# Patient Record
Sex: Female | Born: 1960 | Race: White | Hispanic: No | Marital: Married | Smoking: Never smoker
Health system: Southern US, Community
[De-identification: ages and names within clinical notes are randomized; demographics above are authoritative.]

## PROBLEM LIST (undated history)

## (undated) DIAGNOSIS — N200 Calculus of kidney: Secondary | ICD-10-CM

## (undated) DIAGNOSIS — I10 Essential (primary) hypertension: Secondary | ICD-10-CM

---

## 2021-03-09 ENCOUNTER — Emergency Department (HOSPITAL_BASED_OUTPATIENT_CLINIC_OR_DEPARTMENT_OTHER): Payer: BC Managed Care – PPO

## 2021-03-09 ENCOUNTER — Emergency Department (HOSPITAL_COMMUNITY): Payer: BC Managed Care – PPO

## 2021-03-09 ENCOUNTER — Inpatient Hospital Stay (HOSPITAL_BASED_OUTPATIENT_CLINIC_OR_DEPARTMENT_OTHER)
Admission: EM | Admit: 2021-03-09 | Discharge: 2021-03-12 | DRG: 156 | Disposition: A | Discharge status: Home / Self Care | Payer: BC Managed Care – PPO | Attending: Internal Medicine | Admitting: Internal Medicine

## 2021-03-09 DIAGNOSIS — I639 Cerebral infarction, unspecified: Secondary | ICD-10-CM | POA: Diagnosis not present

## 2021-03-09 DIAGNOSIS — G463 Brain stem stroke syndrome: Secondary | ICD-10-CM | POA: Diagnosis not present

## 2021-03-09 DIAGNOSIS — Z91048 Other nonmedicinal substance allergy status: Secondary | ICD-10-CM

## 2021-03-09 DIAGNOSIS — Z888 Allergy status to other drugs, medicaments and biological substances status: Secondary | ICD-10-CM

## 2021-03-09 DIAGNOSIS — R1310 Dysphagia, unspecified: Secondary | ICD-10-CM | POA: Diagnosis present

## 2021-03-09 DIAGNOSIS — E7800 Pure hypercholesterolemia, unspecified: Secondary | ICD-10-CM

## 2021-03-09 DIAGNOSIS — Z882 Allergy status to sulfonamides status: Secondary | ICD-10-CM

## 2021-03-09 DIAGNOSIS — I10 Essential (primary) hypertension: Secondary | ICD-10-CM | POA: Diagnosis present

## 2021-03-09 DIAGNOSIS — Z881 Allergy status to other antibiotic agents status: Secondary | ICD-10-CM

## 2021-03-09 DIAGNOSIS — J3801 Paralysis of vocal cords and larynx, unilateral: Secondary | ICD-10-CM | POA: Diagnosis not present

## 2021-03-09 DIAGNOSIS — W57XXXA Bitten or stung by nonvenomous insect and other nonvenomous arthropods, initial encounter: Secondary | ICD-10-CM | POA: Diagnosis present

## 2021-03-09 DIAGNOSIS — M25612 Stiffness of left shoulder, not elsewhere classified: Secondary | ICD-10-CM

## 2021-03-09 DIAGNOSIS — E042 Nontoxic multinodular goiter: Secondary | ICD-10-CM | POA: Diagnosis present

## 2021-03-09 DIAGNOSIS — J351 Hypertrophy of tonsils: Secondary | ICD-10-CM | POA: Diagnosis present

## 2021-03-09 DIAGNOSIS — R490 Dysphonia: Secondary | ICD-10-CM

## 2021-03-09 DIAGNOSIS — J3800 Paralysis of vocal cords and larynx, unspecified: Secondary | ICD-10-CM

## 2021-03-09 DIAGNOSIS — E041 Nontoxic single thyroid nodule: Secondary | ICD-10-CM

## 2021-03-09 DIAGNOSIS — E538 Deficiency of other specified B group vitamins: Secondary | ICD-10-CM | POA: Diagnosis present

## 2021-03-09 DIAGNOSIS — E860 Dehydration: Secondary | ICD-10-CM | POA: Diagnosis present

## 2021-03-09 DIAGNOSIS — Z91040 Latex allergy status: Secondary | ICD-10-CM

## 2021-03-09 DIAGNOSIS — R221 Localized swelling, mass and lump, neck: Secondary | ICD-10-CM

## 2021-03-09 DIAGNOSIS — Z793 Long term (current) use of hormonal contraceptives: Secondary | ICD-10-CM

## 2021-03-09 DIAGNOSIS — R030 Elevated blood-pressure reading, without diagnosis of hypertension: Secondary | ICD-10-CM | POA: Diagnosis present

## 2021-03-09 DIAGNOSIS — Z20822 Contact with and (suspected) exposure to covid-19: Secondary | ICD-10-CM | POA: Diagnosis present

## 2021-03-09 HISTORY — DX: Essential (primary) hypertension: I10

## 2021-03-09 HISTORY — DX: Calculus of kidney: N20.0

## 2021-03-09 LAB — CBC WITH DIFFERENTIAL/PLATELET
Abs Immature Granulocytes: 0.02 10*3/uL (ref 0.00–0.07)
Basophils Absolute: 0 10*3/uL (ref 0.0–0.1)
Basophils Relative: 1 %
Eosinophils Absolute: 0.1 10*3/uL (ref 0.0–0.5)
Eosinophils Relative: 2 %
HCT: 41 % (ref 36.0–46.0)
Hemoglobin: 14 g/dL (ref 12.0–15.0)
Immature Granulocytes: 0 %
Lymphocytes Relative: 26 %
Lymphs Abs: 1.9 10*3/uL (ref 0.7–4.0)
MCH: 29.9 pg (ref 26.0–34.0)
MCHC: 34.1 g/dL (ref 30.0–36.0)
MCV: 87.4 fL (ref 80.0–100.0)
Monocytes Absolute: 0.7 10*3/uL (ref 0.1–1.0)
Monocytes Relative: 10 %
Neutro Abs: 4.5 10*3/uL (ref 1.7–7.7)
Neutrophils Relative %: 61 %
Platelets: 246 10*3/uL (ref 150–400)
RBC: 4.69 MIL/uL (ref 3.87–5.11)
RDW: 13.2 % (ref 11.5–15.5)
WBC: 7.3 10*3/uL (ref 4.0–10.5)
nRBC: 0 % (ref 0.0–0.2)

## 2021-03-09 LAB — COMPREHENSIVE METABOLIC PANEL
ALT: 17 U/L (ref 0–44)
AST: 17 U/L (ref 15–41)
Albumin: 4.6 g/dL (ref 3.5–5.0)
Alkaline Phosphatase: 72 U/L (ref 38–126)
Anion gap: 9 (ref 5–15)
BUN: 15 mg/dL (ref 6–20)
CO2: 27 mmol/L (ref 22–32)
Calcium: 9.6 mg/dL (ref 8.9–10.3)
Chloride: 105 mmol/L (ref 98–111)
Creatinine, Ser: 0.72 mg/dL (ref 0.44–1.00)
GFR, Estimated: >60 mL/min (ref >60)
Glucose, Bld: 94 mg/dL (ref 70–99)
Potassium: 3.8 mmol/L (ref 3.5–5.1)
Sodium: 141 mmol/L (ref 135–145)
Total Bilirubin: 0.7 mg/dL (ref 0.3–1.2)
Total Protein: 7.4 g/dL (ref 6.5–8.1)

## 2021-03-09 LAB — RESP PANEL BY RT-PCR (FLU A&B, COVID) ARPGX2
Influenza A by PCR: NEGATIVE
Influenza B by PCR: NEGATIVE
SARS Coronavirus 2 by RT PCR: NEGATIVE

## 2021-03-09 LAB — GROUP A STREP BY PCR: Group A Strep by PCR: NOT DETECTED

## 2021-03-09 IMAGING — MR MR CERVICAL SPINE WO/W CM
5 of 8 series · 25 of 48 positions shown · IV contrast (Gadavist)
Comparison: Prior CT from earlier the same day.

CLINICAL DATA: Initial evaluation for possible stroke versus
demyelinating disease.

EXAM:
MRI HEAD WITHOUT AND WITH CONTRAST
MRI CERVICAL SPINE WITHOUT AND WITH CONTRAST
TECHNIQUE: Multiplanar, multiecho pulse sequences of the brain and surrounding
structures, and cervical spine, to include the craniocervical
junction and cervicothoracic junction, were obtained without and
with intravenous contrast.
CONTRAST:  7mL GADAVIST GADOBUTROL 1 MMOL/ML IV SOLN

[Series 6: T2 · sagittal · 3.0mm · 0.69mm/px · 3 of 15 slices shown (1 of 2)]
[im 1/15]
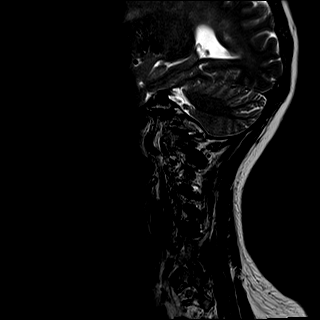
[im 8/15]
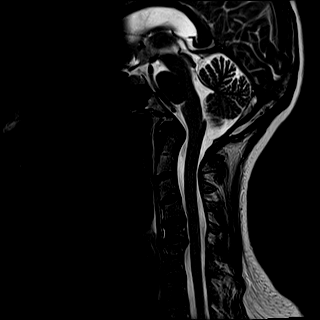
[im 15/15]
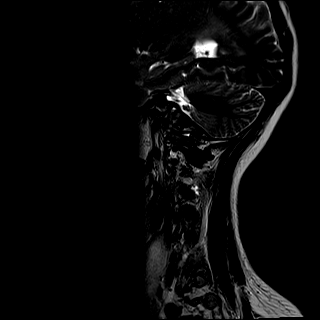

[Series 7: T1 · sagittal · 3.0mm · 0.69mm/px · 3 of 15 slices shown (1 of 2)]
[im 1/15]
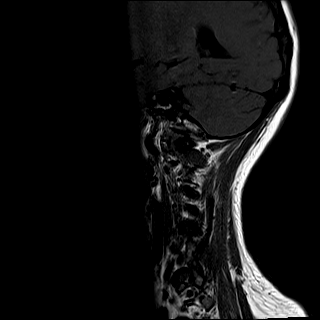
[im 8/15]
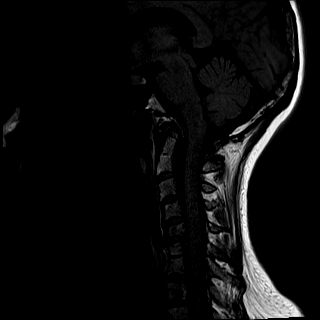
[im 15/15]
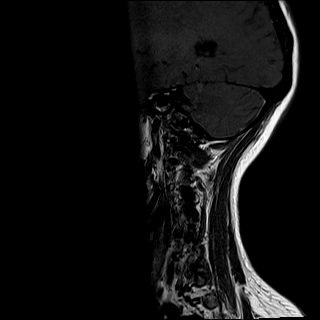

[Series 9: T2 · axial · 3.0mm · 0.66mm/px · z∈[-177,-61]mm · 9 of 39 slices shown (2 of 2)]
[im 1/39]
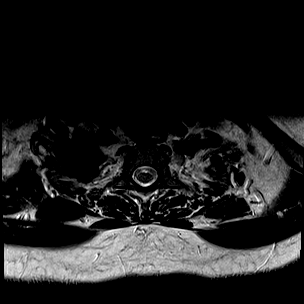
[im 5/39]
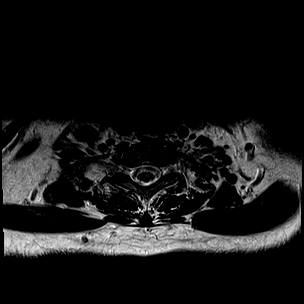
[im 10/39]
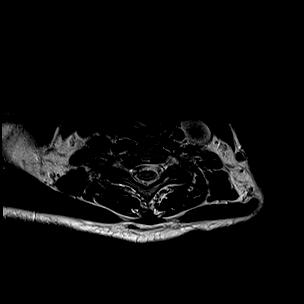
[im 15/39]
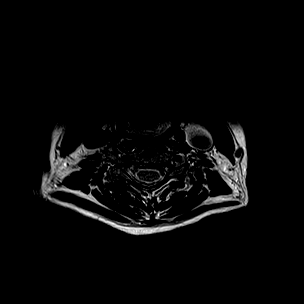
[im 20/39]
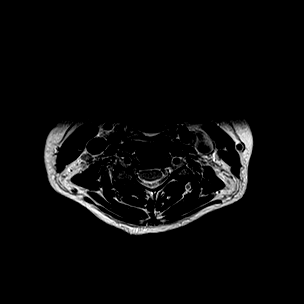
[im 24/39]
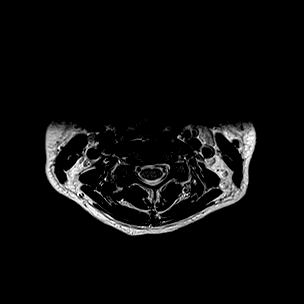
[im 29/39]
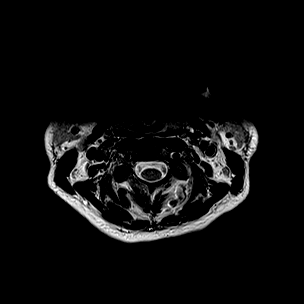
[im 34/39]
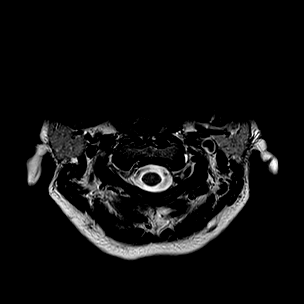
[im 39/39]
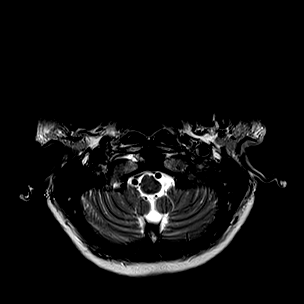

[Series 11: T1 · axial · 3.0mm · 0.39mm/px · z∈[-177,-61]mm · 9 of 39 slices shown (2 of 2)]
[im 1/39]
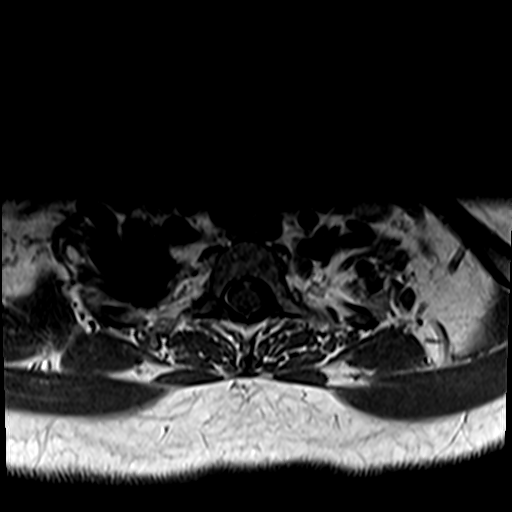
[im 5/39]
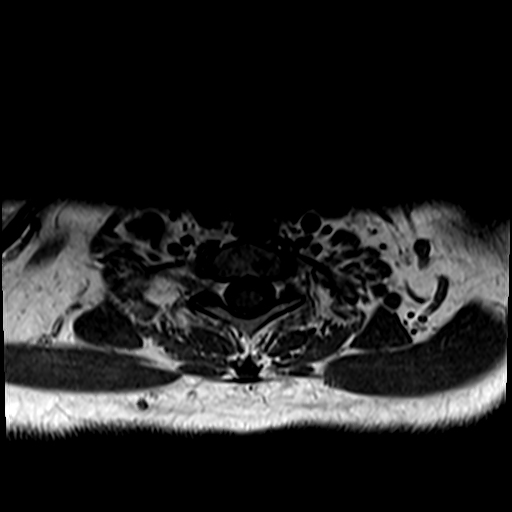
[im 10/39]
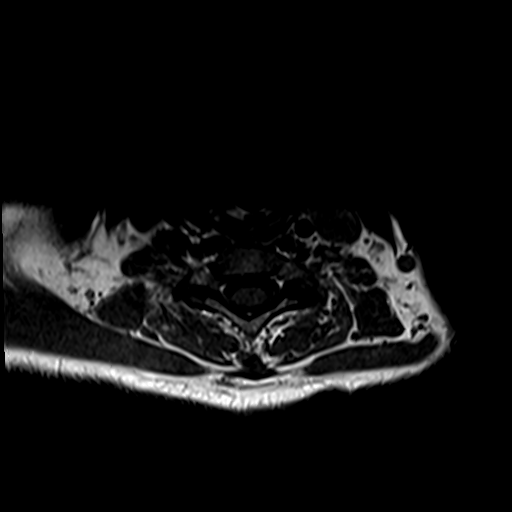
[im 15/39]
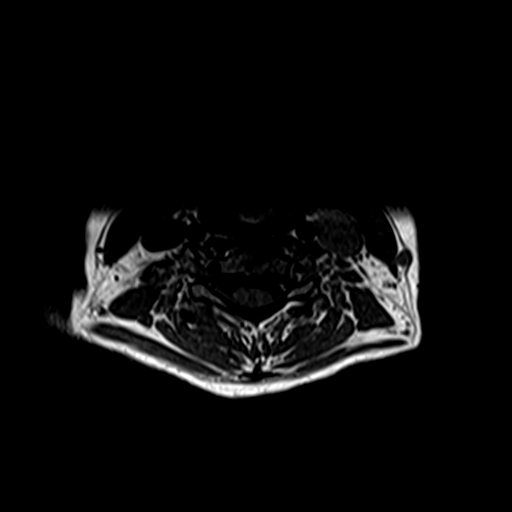
[im 20/39]
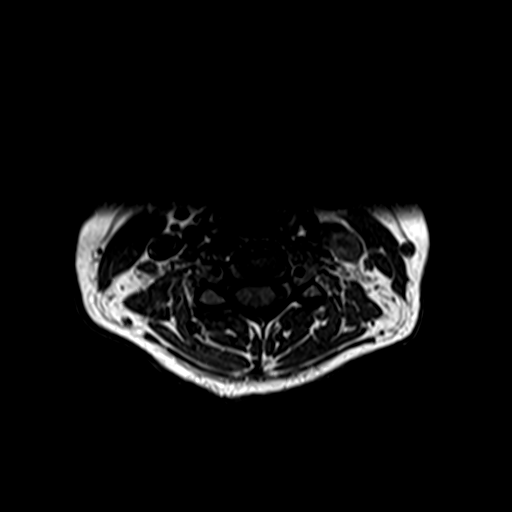
[im 24/39]
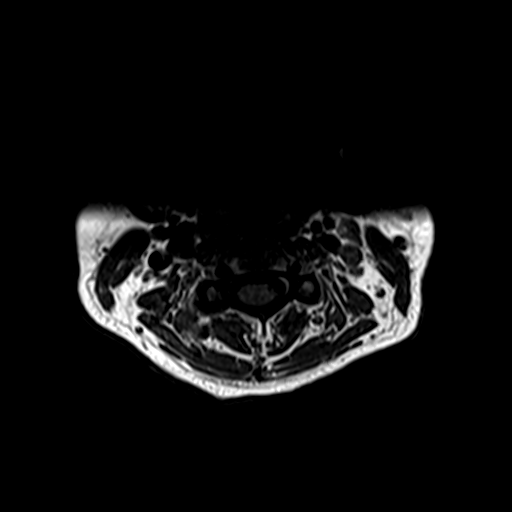
[im 29/39]
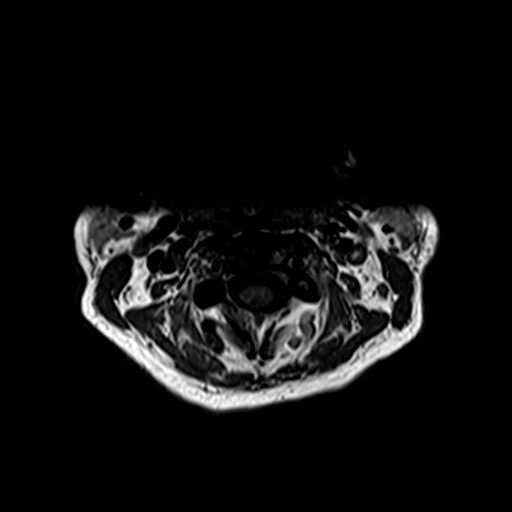
[im 34/39]
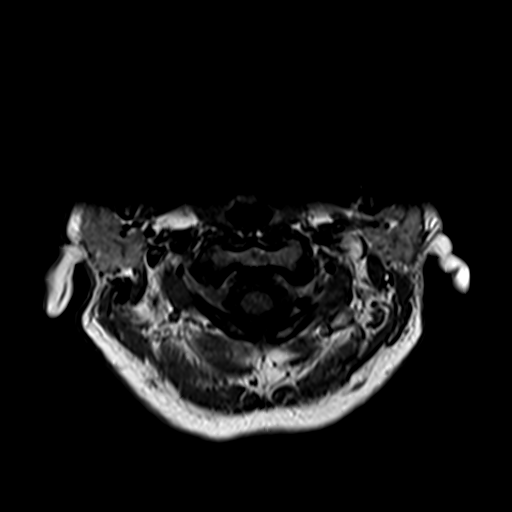
[im 39/39]
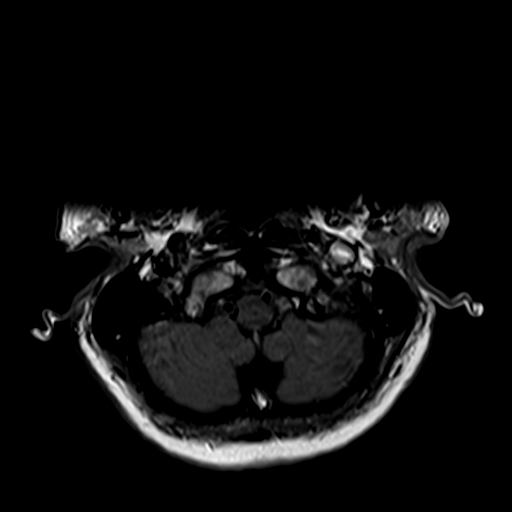

[Series 12: T1 fat-sat post-contrast · sagittal · 3.0mm · 0.43mm/px · 1 of 15 slices shown]
[im 1/15]
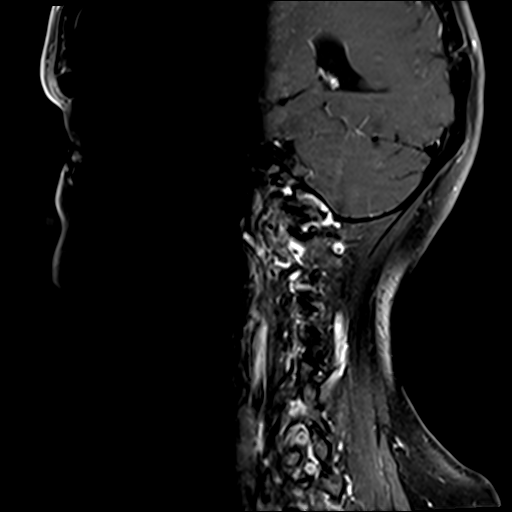

[25 of 48 positions shown; findings below may reference images not displayed]

FINDINGS: MRI HEAD FINDINGS

Brain: Cerebral volume within normal limits for age. Patchy and hazy
T2/FLAIR hyperintensity seen involving the periventricular and deep
white matter of both cerebral hemispheres, nonspecific, but overall
mild to moderate in nature. Overall, changes are most pronounced
within the right frontal lobe (series 11, image 18). Few
corresponding areas of T1 hypointensity noted about the basal
ganglia (series 16, images 28, 31), favored to reflect small remote
lacunar infarcts. No infratentorial signal abnormality.

No abnormal enhancement to suggest acute or subacute ischemia or
active demyelination. Gray-white matter differentiation maintained.
No encephalomalacia to suggest chronic cortical infarction. No foci
of susceptibility artifact to suggest acute or chronic intracranial
hemorrhage.

No definite mass lesion. No mass effect or midline shift. Ventricles
normal in size without hydrocephalus. Cavum et septum pellucidum
noted. No extra-axial fluid collection. Pituitary gland and
suprasellar region within normal limits. Midline structures intact
and normal. Prominent calcification noted along the anterior falx.

There is an apparent punctate 3 mm focus of enhancement involving
the inferior left basal ganglia (series 18, image 21). Finding of
uncertain etiology or significance. No other abnormal enhancement.

Vascular: Major intracranial vascular flow voids are maintained.

Skull and upper cervical spine: Craniocervical junction within
normal limits. Bone marrow signal intensity normal. No scalp soft
tissue abnormality.

Sinuses/Orbits: Globes and orbital soft tissues within normal
limits. Paranasal sinuses are largely clear. No mastoid effusion.
Inner ear structures grossly normal.

Other: None.

MRI CERVICAL SPINE FINDINGS

Alignment: Straightening with mild reversal of the normal cervical
lordosis. Trace anterolisthesis of C4 on C5, with trace
retrolisthesis of C5 on C6.

Vertebrae: Congenital fusion of the C6 and C7 vertebral bodies as
well as their posterior elements noted. Vertebral body height
maintained without acute or chronic fracture. Bone marrow signal
intensity within normal limits. No discrete or worrisome osseous
lesions. No abnormal marrow edema or enhancement.

Cord: Signal intensity within the cervical spinal cord is normal.
Normal cord signal changes to suggest demyelinating disease. Normal
cord caliber morphology. No abnormal enhancement.

Posterior Fossa, vertebral arteries, paraspinal tissues:
Craniocervical junction within normal limits. Paraspinous and
prevertebral soft tissues within normal limits. Normal flow voids
seen within the vertebral arteries bilaterally.

Disc levels:

C2-C3: Unremarkable.

C3-C4: Mild right greater than left uncovertebral hypertrophy
without significant disc bulge. Mild right-sided facet degeneration.
No spinal stenosis. Mild right C4 foraminal narrowing. Left neural
foramina remains patent.

C4-C5: Mild disc bulge with uncovertebral and endplate spurring.
Flattening of the ventral thecal sac without significant spinal
stenosis or cord deformity. Mild right C5 foraminal stenosis. Left
neural foramina remains patent.

C5-C6: Degenerative intervertebral disc space narrowing with diffuse
disc osteophyte complex. Broad posterior component flattens and
partially faces the ventral thecal sac with mild spinal stenosis. No
more than minimal flattening of the ventral cord. Moderate left with
mild right C6 foraminal narrowing.

C6-C7: Congenital fusion of C6 and C7. No canal or foraminal
stenosis.

C7-T1: Mild disc bulge with right greater than left uncovertebral
hypertrophy. Bilateral facet degeneration. No spinal stenosis. Mild
to moderate right C8 foraminal narrowing. Left neural foramina
remains patent.

Visualized upper thoracic spine demonstrates no significant finding.
IMPRESSION: MRI HEAD IMPRESSION:

1. Patchy and hazy T2/FLAIR hyperintensity involving the
supratentorial cerebral white matter as above, nonspecific, but
overall mild to moderate in nature. While these findings are
nonspecific, changes of chronic microvascular ischemic disease are
favored. Possible demyelinating disease is not entirely excluded,
although appearance would be atypical for this process. No
convincing changes of active demyelination.
2. Punctate 3 mm focus of enhancement involving the inferior left
basal ganglia. Finding is of uncertain etiology or significance, and
could reflect a small vascular structure. Outpatient neurology
follow-up with short interval follow-up MRI in 2-3 months to
document stability recommended.
3. Otherwise negative brain MRI for age. No other acute intracranial
abnormality.

MRI CERVICAL SPINE IMPRESSION:

1. Normal MRI appearance of the cervical spinal cord. No evidence
for demyelinating disease.
2. Multilevel degenerative spondylosis with resultant mild spinal
stenosis at C5-6. Associated mild-to-moderate right C4, C5, C6, and
C8 foraminal stenosis, with moderate left C6 foraminal narrowing.

## 2021-03-09 IMAGING — DX DG CHEST 1V PORT
1 series · 1 of 1 positions shown · non-contrast
Comparison: None.

CLINICAL DATA: Shortness of breath.  Pharyngitis.

EXAM:
PORTABLE CHEST 1 VIEW

[chest]
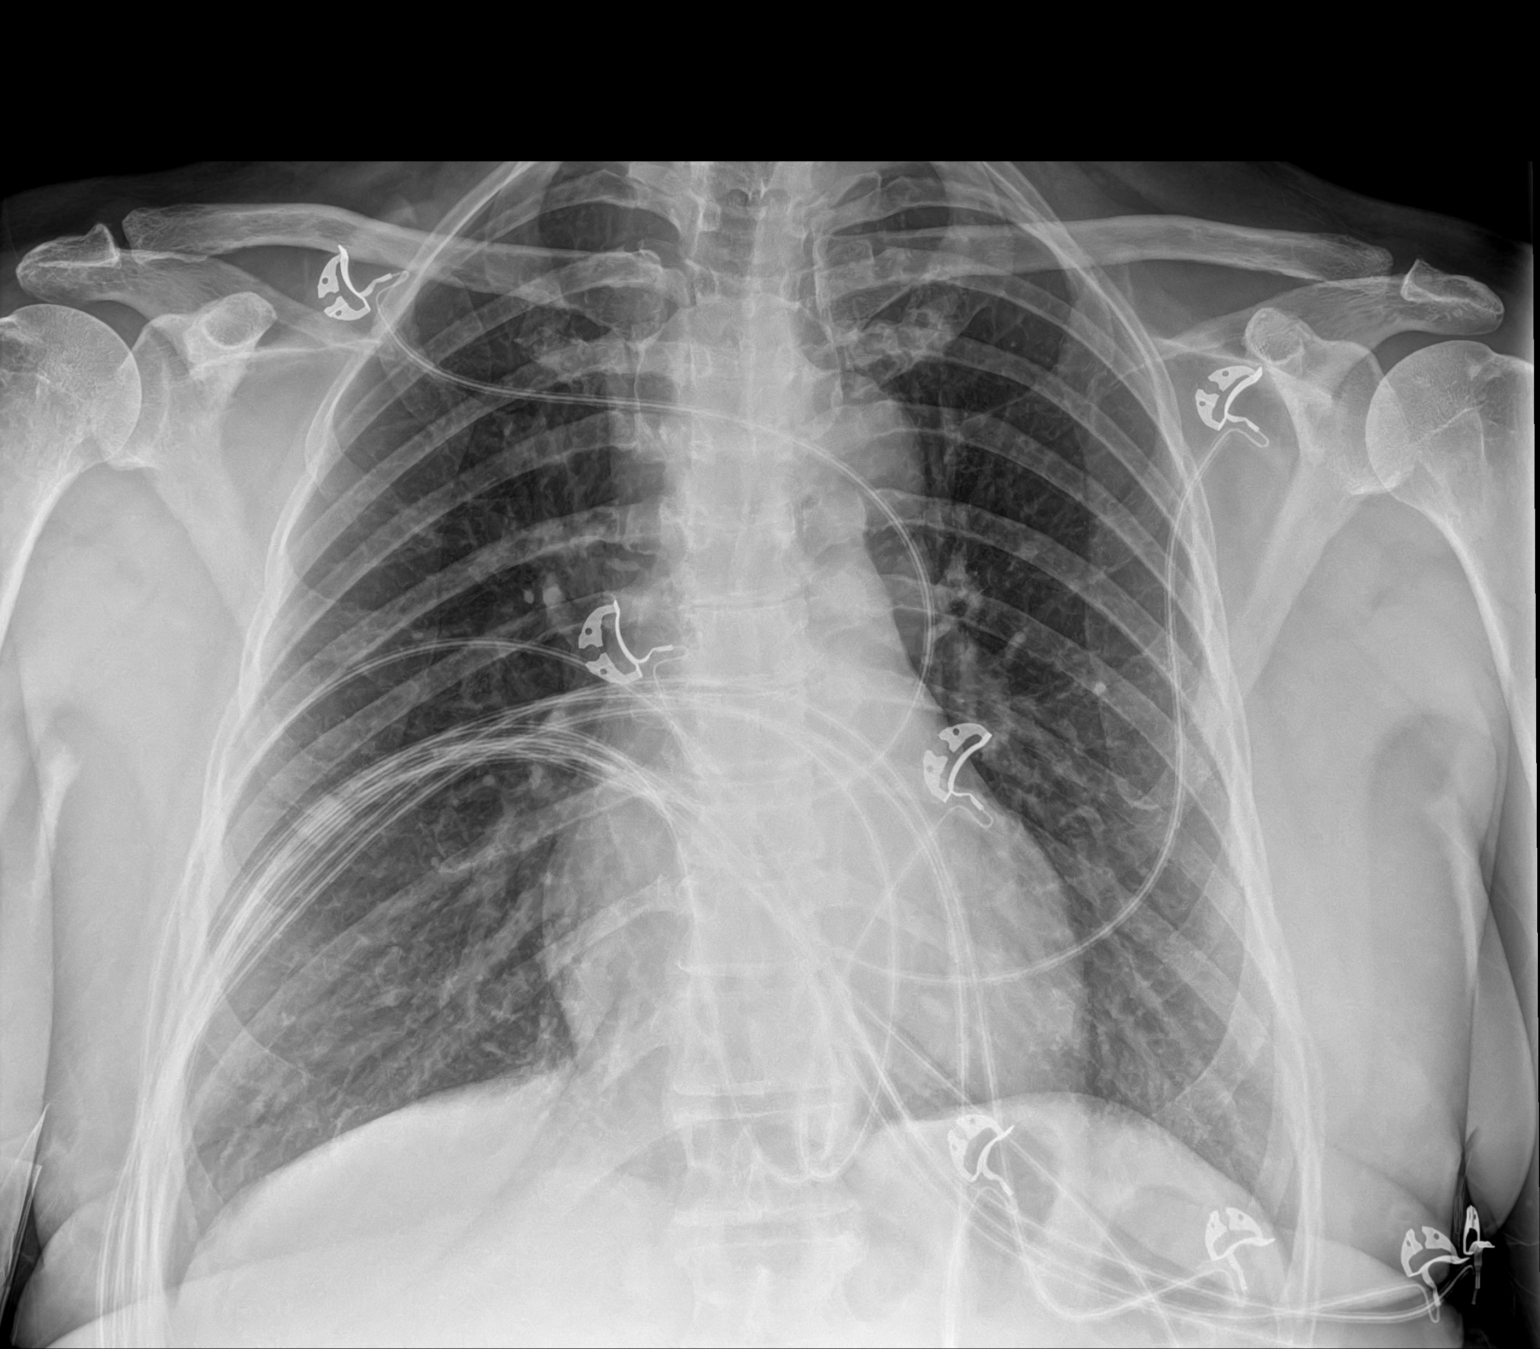

[1 of 1 positions shown; findings below may reference images not displayed]

FINDINGS: The heart size and mediastinal contours are within normal limits.
Both lungs are clear. The visualized skeletal structures are
unremarkable.
IMPRESSION: No active disease.

## 2021-03-09 IMAGING — MR MR HEAD WO/W CM
14 of 16 series · 41 of 48 positions shown · IV contrast (gadavist)
Comparison: Prior CT from earlier the same day.

CLINICAL DATA: Initial evaluation for possible stroke versus
demyelinating disease.

EXAM:
MRI HEAD WITHOUT AND WITH CONTRAST
MRI CERVICAL SPINE WITHOUT AND WITH CONTRAST
TECHNIQUE: Multiplanar, multiecho pulse sequences of the brain and surrounding
structures, and cervical spine, to include the craniocervical
junction and cervicothoracic junction, were obtained without and
with intravenous contrast.
CONTRAST:  7mL GADAVIST GADOBUTROL 1 MMOL/ML IV SOLN

[Series 5: DWI · axial · 3.0mm · 0.88mm/px · z∈[-64,+78]mm · 7 of 100 slices shown (1 of 4)]
[im 1/100]
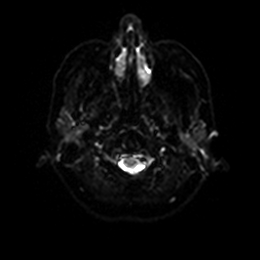
[im 17/100]
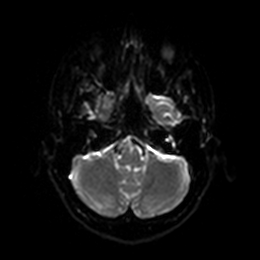
[im 34/100]
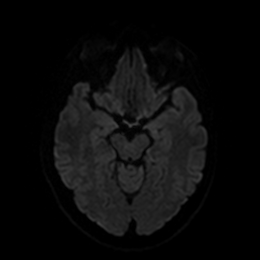
[im 50/100]
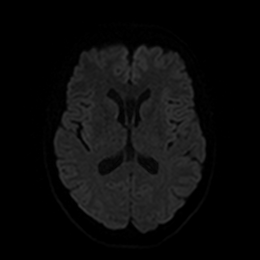
[im 67/100]
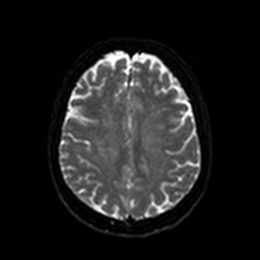
[im 83/100]
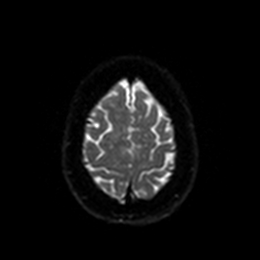
[im 100/100]
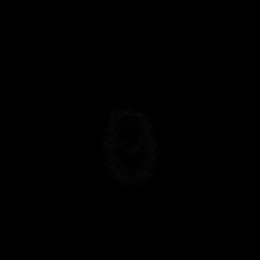

[Series 6: DWI · axial · 3.0mm · 0.88mm/px · z∈[-64,+78]mm · 3 of 50 slices shown (2 of 4)]
[im 1/50]
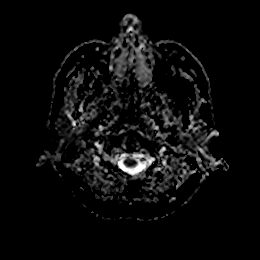
[im 25/50]
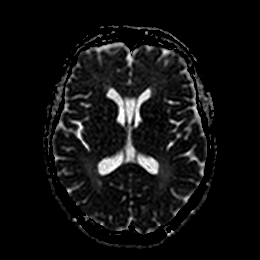
[im 50/50]
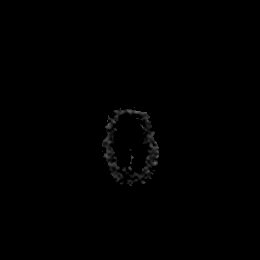

[Series 7: DWI · coronal · 4.0mm · 0.88mm/px · 5 of 76 slices shown (3 of 4)]
[im 1/76]
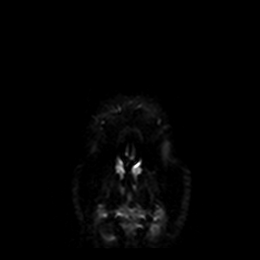
[im 19/76]
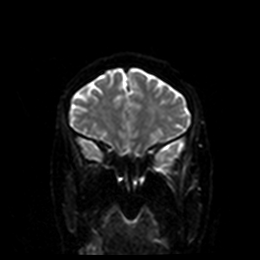
[im 38/76]
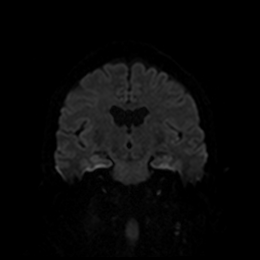
[im 57/76]
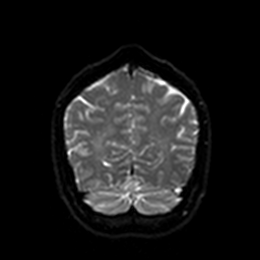
[im 76/76]
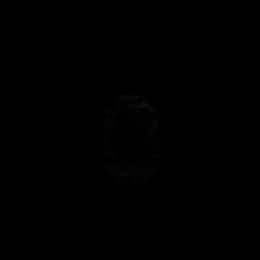

[Series 8: DWI · coronal · 4.0mm · 0.88mm/px · 3 of 38 slices shown (4 of 4)]
[im 1/38]
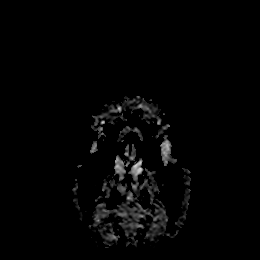
[im 19/38]
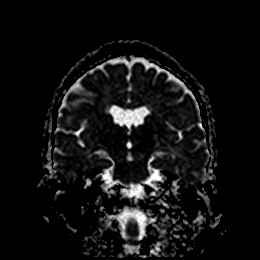
[im 38/38]
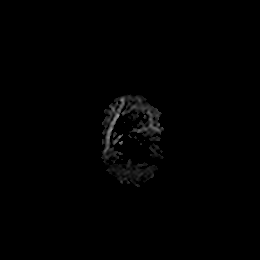

[Series 9: T1 · sagittal · 5.0mm · 0.75mm/px · 2 of 25 slices shown (1 of 2)]
[im 1/25]
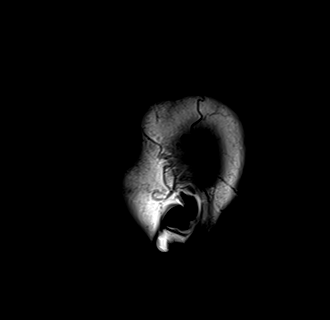
[im 25/25]
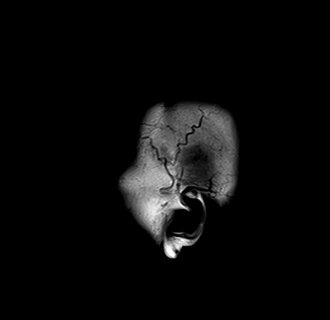

[Series 10: T2 · axial · 5.0mm · 0.72mm/px · z∈[-62,+77]mm · 2 of 25 slices shown]
[im 1/25]
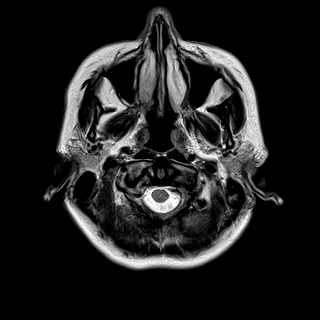
[im 25/25]
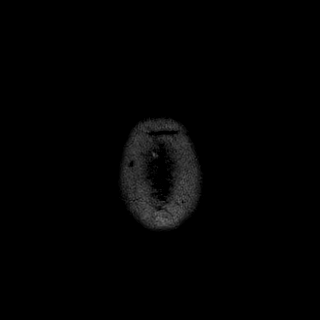

[Series 11: FLAIR · axial · 5.0mm · 0.45mm/px · z∈[-61,+78]mm · 2 of 25 slices shown]
[im 1/25]
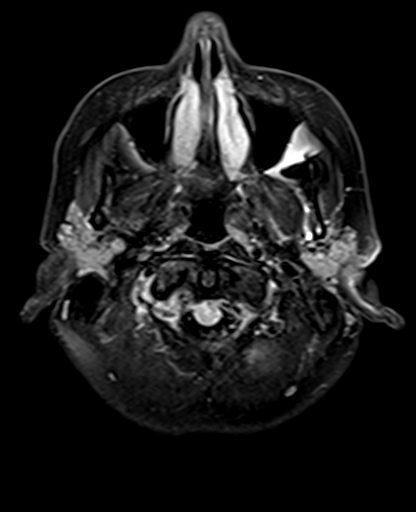
[im 25/25]
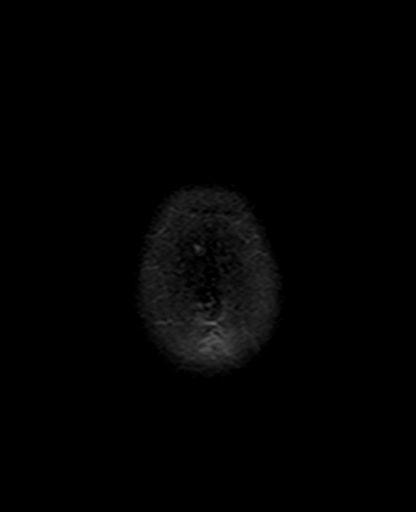

[Series 12: mag_images · axial · 3.0mm · 0.90mm/px · z∈[-65,+82]mm · 3 of 52 slices shown]
[im 1/52]
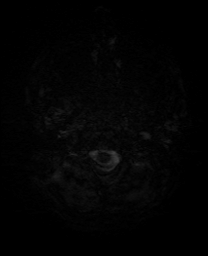
[im 26/52]
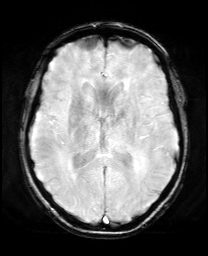
[im 52/52]
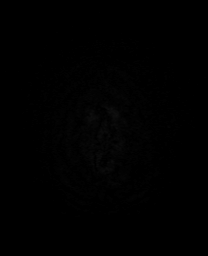

[Series 13: pha_images · axial · 3.0mm · 0.90mm/px · z∈[-65,+76]mm · 3 of 50 slices shown]
[im 1/50]
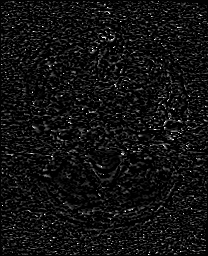
[im 25/50]
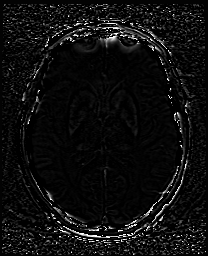
[im 50/50]
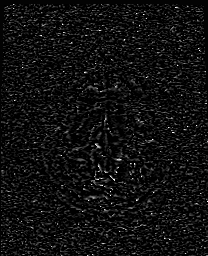

[Series 14: swi_images · axial · 3.0mm · 0.90mm/px · z∈[-65,+82]mm · 3 of 52 slices shown]
[im 1/52]
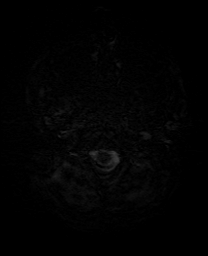
[im 26/52]
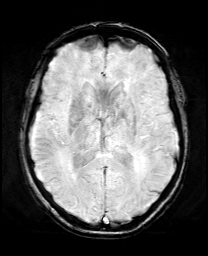
[im 52/52]
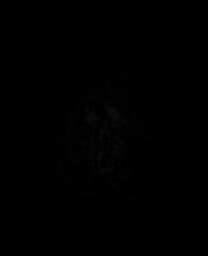

[Series 15: mip_images(sw) · axial · 24.0mm · 0.90mm/px · z∈[-55,+9]mm · 2 of 45 slices shown]
[im 1/45]
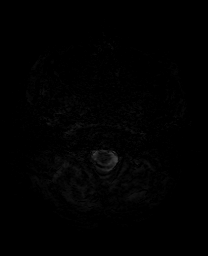
[im 23/45]
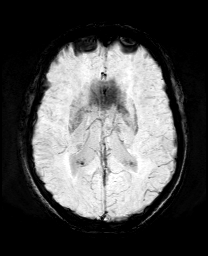

[Series 17: T2 post-contrast · coronal · 5.0mm · 0.72mm/px · 2 of 31 slices shown]
[im 1/31]
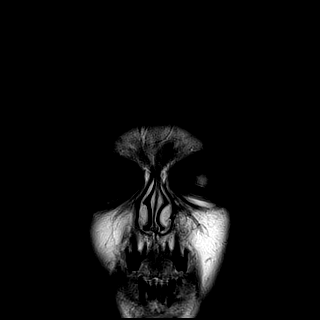
[im 31/31]
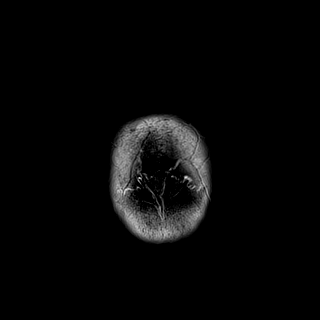

[Series 19: T1 post-contrast · coronal · 5.0mm · 0.34mm/px · 2 of 31 slices shown]
[im 1/31]
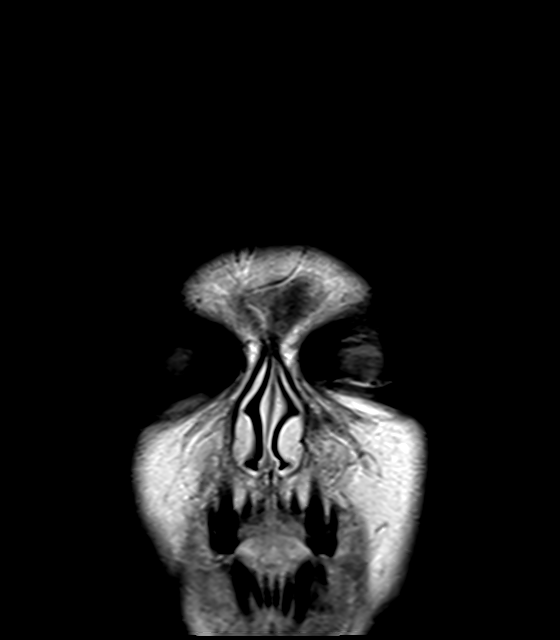
[im 31/31]
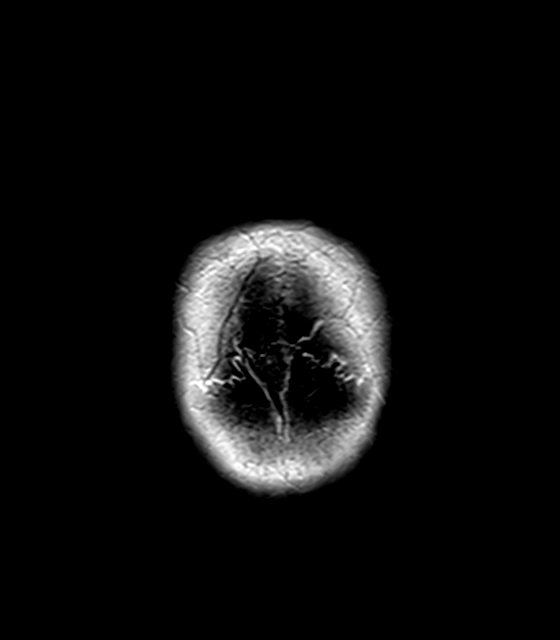

[Series 20: T1 · sagittal · 5.0mm · 0.75mm/px · 2 of 25 slices shown (2 of 2)]
[im 1/25]
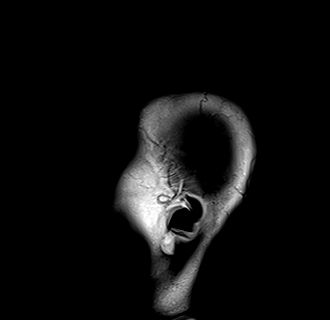
[im 25/25]
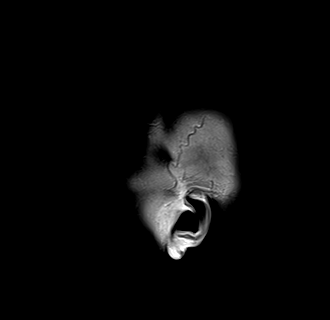

[41 of 48 positions shown; findings below may reference images not displayed]

FINDINGS: MRI HEAD FINDINGS

Brain: Cerebral volume within normal limits for age. Patchy and hazy
T2/FLAIR hyperintensity seen involving the periventricular and deep
white matter of both cerebral hemispheres, nonspecific, but overall
mild to moderate in nature. Overall, changes are most pronounced
within the right frontal lobe (series 11, image 18). Few
corresponding areas of T1 hypointensity noted about the basal
ganglia (series 16, images 28, 31), favored to reflect small remote
lacunar infarcts. No infratentorial signal abnormality.

No abnormal enhancement to suggest acute or subacute ischemia or
active demyelination. Gray-white matter differentiation maintained.
No encephalomalacia to suggest chronic cortical infarction. No foci
of susceptibility artifact to suggest acute or chronic intracranial
hemorrhage.

No definite mass lesion. No mass effect or midline shift. Ventricles
normal in size without hydrocephalus. Cavum et septum pellucidum
noted. No extra-axial fluid collection. Pituitary gland and
suprasellar region within normal limits. Midline structures intact
and normal. Prominent calcification noted along the anterior falx.

There is an apparent punctate 3 mm focus of enhancement involving
the inferior left basal ganglia (series 18, image 21). Finding of
uncertain etiology or significance. No other abnormal enhancement.

Vascular: Major intracranial vascular flow voids are maintained.

Skull and upper cervical spine: Craniocervical junction within
normal limits. Bone marrow signal intensity normal. No scalp soft
tissue abnormality.

Sinuses/Orbits: Globes and orbital soft tissues within normal
limits. Paranasal sinuses are largely clear. No mastoid effusion.
Inner ear structures grossly normal.

Other: None.

MRI CERVICAL SPINE FINDINGS

Alignment: Straightening with mild reversal of the normal cervical
lordosis. Trace anterolisthesis of C4 on C5, with trace
retrolisthesis of C5 on C6.

Vertebrae: Congenital fusion of the C6 and C7 vertebral bodies as
well as their posterior elements noted. Vertebral body height
maintained without acute or chronic fracture. Bone marrow signal
intensity within normal limits. No discrete or worrisome osseous
lesions. No abnormal marrow edema or enhancement.

Cord: Signal intensity within the cervical spinal cord is normal.
Normal cord signal changes to suggest demyelinating disease. Normal
cord caliber morphology. No abnormal enhancement.

Posterior Fossa, vertebral arteries, paraspinal tissues:
Craniocervical junction within normal limits. Paraspinous and
prevertebral soft tissues within normal limits. Normal flow voids
seen within the vertebral arteries bilaterally.

Disc levels:

C2-C3: Unremarkable.

C3-C4: Mild right greater than left uncovertebral hypertrophy
without significant disc bulge. Mild right-sided facet degeneration.
No spinal stenosis. Mild right C4 foraminal narrowing. Left neural
foramina remains patent.

C4-C5: Mild disc bulge with uncovertebral and endplate spurring.
Flattening of the ventral thecal sac without significant spinal
stenosis or cord deformity. Mild right C5 foraminal stenosis. Left
neural foramina remains patent.

C5-C6: Degenerative intervertebral disc space narrowing with diffuse
disc osteophyte complex. Broad posterior component flattens and
partially faces the ventral thecal sac with mild spinal stenosis. No
more than minimal flattening of the ventral cord. Moderate left with
mild right C6 foraminal narrowing.

C6-C7: Congenital fusion of C6 and C7. No canal or foraminal
stenosis.

C7-T1: Mild disc bulge with right greater than left uncovertebral
hypertrophy. Bilateral facet degeneration. No spinal stenosis. Mild
to moderate right C8 foraminal narrowing. Left neural foramina
remains patent.

Visualized upper thoracic spine demonstrates no significant finding.
IMPRESSION: MRI HEAD IMPRESSION:

1. Patchy and hazy T2/FLAIR hyperintensity involving the
supratentorial cerebral white matter as above, nonspecific, but
overall mild to moderate in nature. While these findings are
nonspecific, changes of chronic microvascular ischemic disease are
favored. Possible demyelinating disease is not entirely excluded,
although appearance would be atypical for this process. No
convincing changes of active demyelination.
2. Punctate 3 mm focus of enhancement involving the inferior left
basal ganglia. Finding is of uncertain etiology or significance, and
could reflect a small vascular structure. Outpatient neurology
follow-up with short interval follow-up MRI in 2-3 months to
document stability recommended.
3. Otherwise negative brain MRI for age. No other acute intracranial
abnormality.

MRI CERVICAL SPINE IMPRESSION:

1. Normal MRI appearance of the cervical spinal cord. No evidence
for demyelinating disease.
2. Multilevel degenerative spondylosis with resultant mild spinal
stenosis at C5-6. Associated mild-to-moderate right C4, C5, C6, and
C8 foraminal stenosis, with moderate left C6 foraminal narrowing.

## 2021-03-09 IMAGING — CT CT NECK W/ CM
4 of 5 series · 13 of 33 positions shown, 15 images · IV contrast (APPLIED)
Comparison: None.

CLINICAL DATA: Neck abscess, deep tissue.

EXAM:
CT NECK WITH CONTRAST
TECHNIQUE: Multidetector CT imaging of the neck was performed using the
standard protocol following the bolus administration of intravenous
contrast.
CONTRAST:  75mL OMNIPAQUE IOHEXOL 300 MG/ML  SOLN

[Series 3: ax bone · axial · 0.59mm/px · z∈[-501,-405]mm · 3 of 96 slices shown, 4 images]
[im 24/96  soft-tissue]
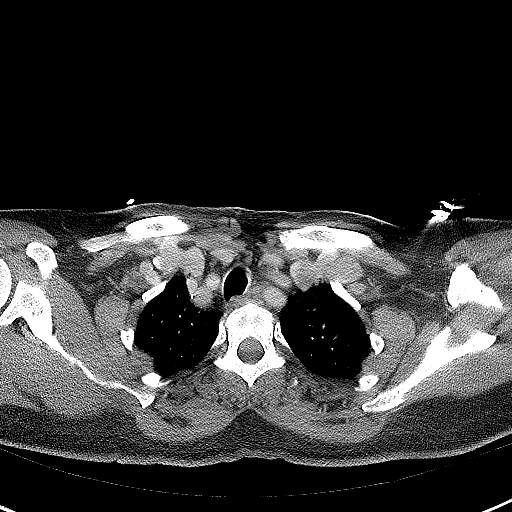
[im 24/96  bone]
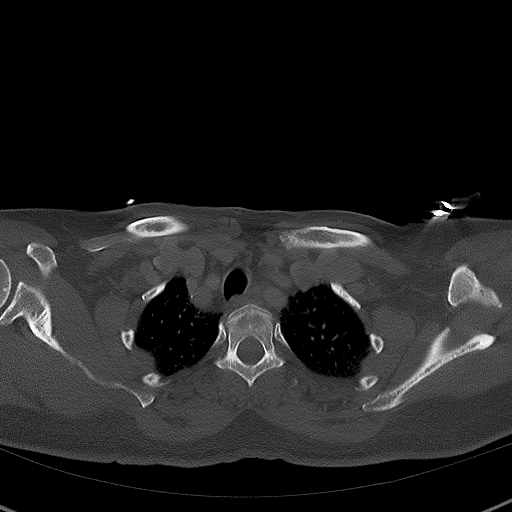
[im 48/96  bone]
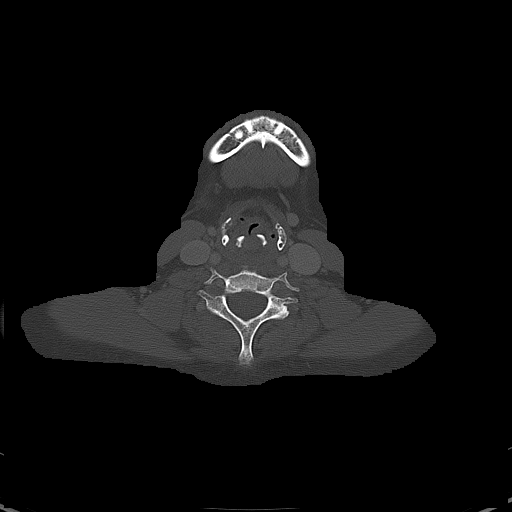
[im 72/96  bone]
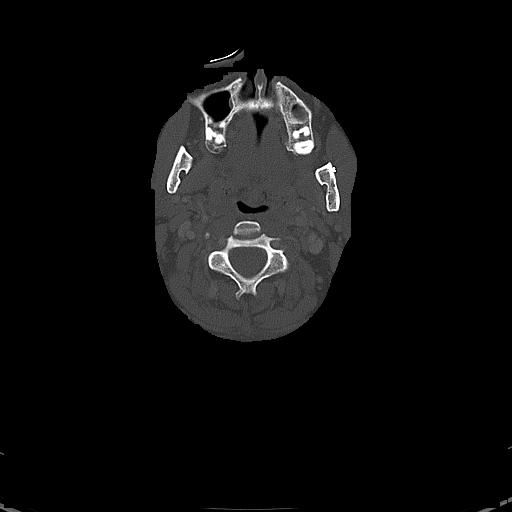

[Series 4: cor neck · coronal · 0.47mm/px · 3 of 114 slices shown]
[im 30/114  bone]
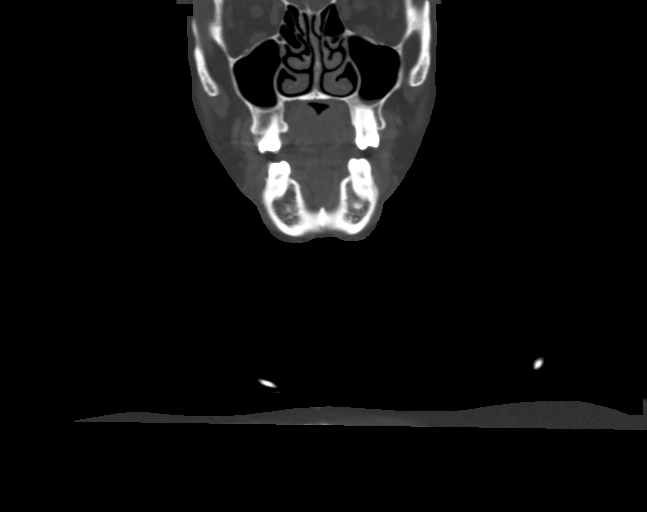
[im 48/114  bone]
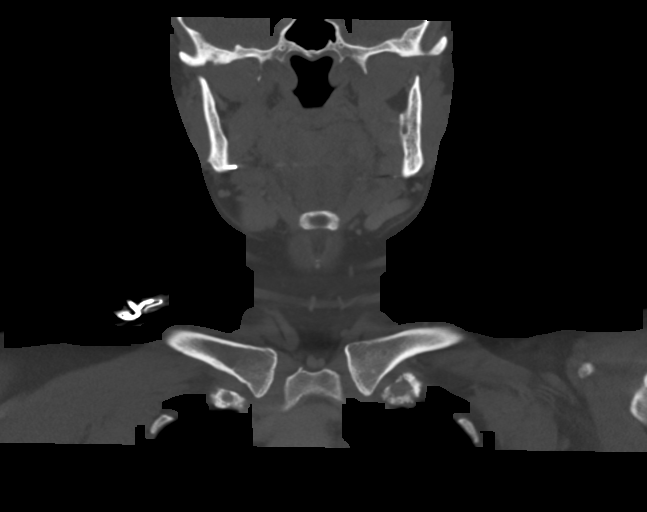
[im 66/114  bone]
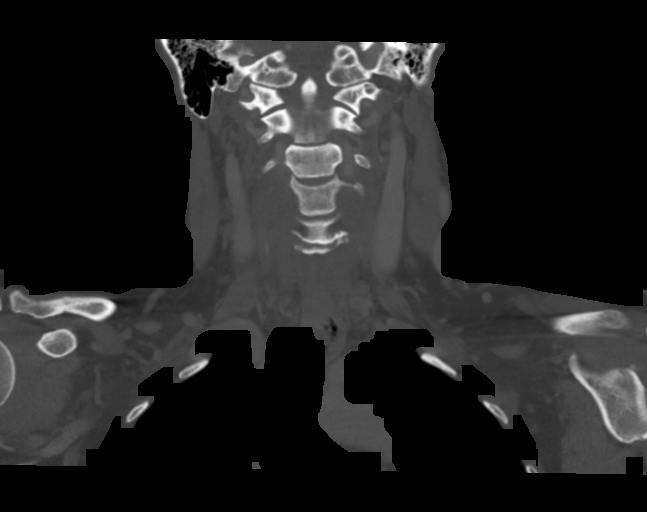

[Series 5: sag neck · sagittal · 0.41mm/px · 5 of 128 slices shown, 6 images]
[im 43/128  bone]
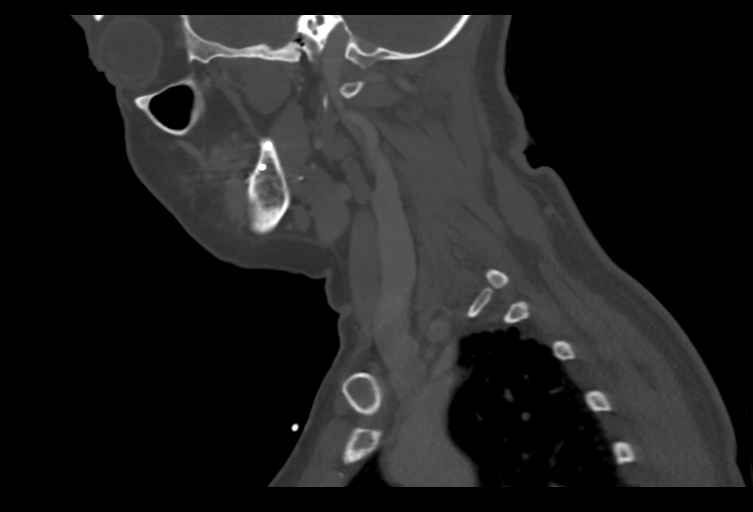
[im 53/128  bone]
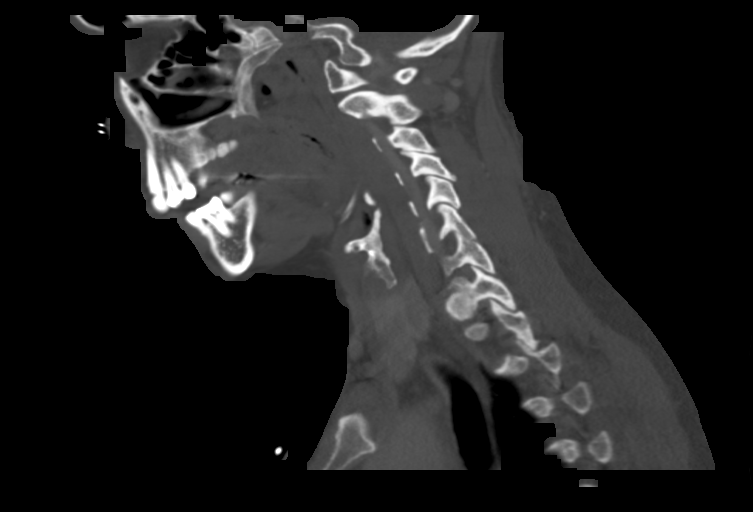
[im 64/128  soft-tissue]
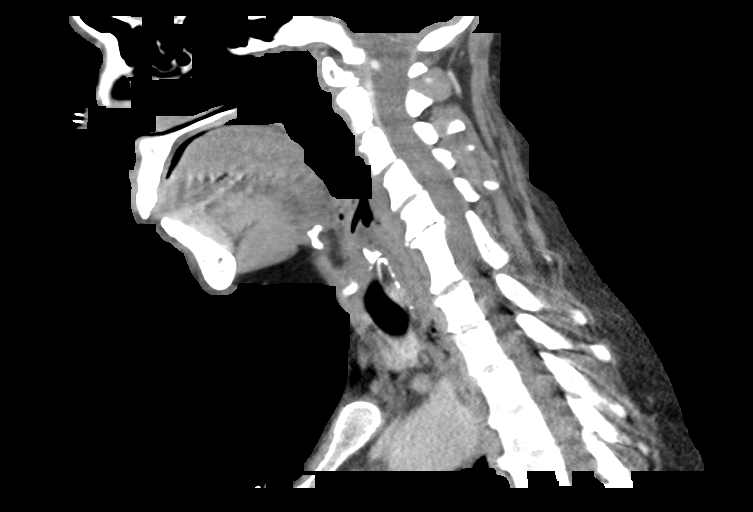
[im 64/128  bone]
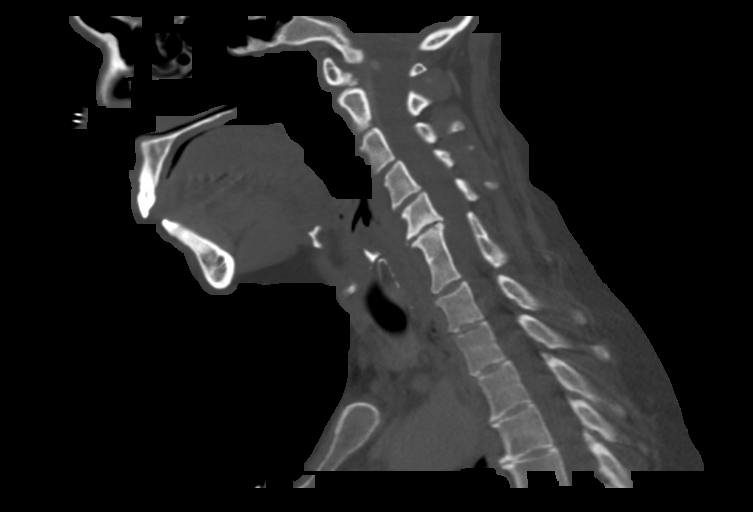
[im 75/128  bone]
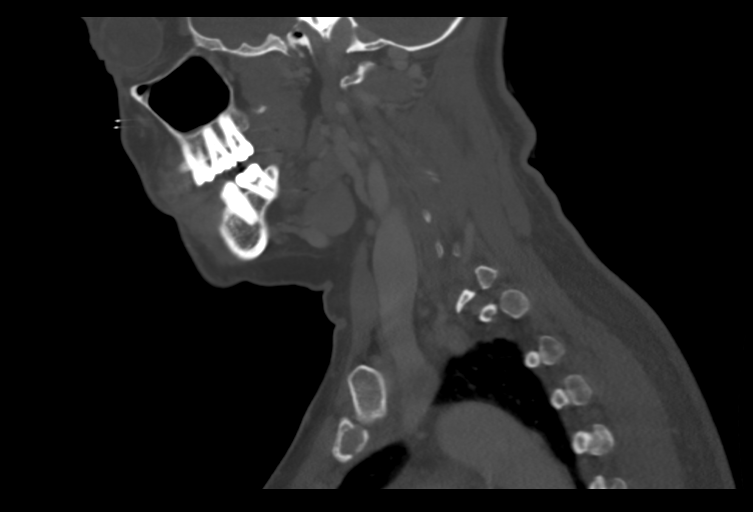
[im 85/128  bone]
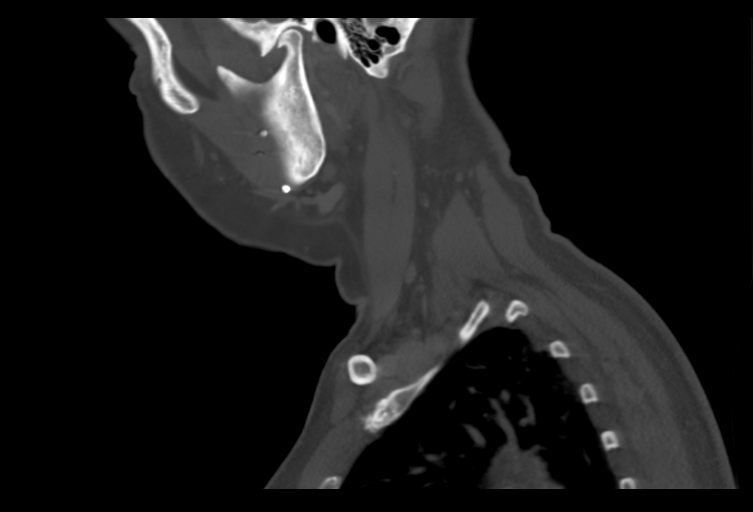

[Series 6: ax oropharynx · axial · 0.45mm/px · z∈[-538,-489]mm · 2 of 103 slices shown]
[im 26/103  bone]
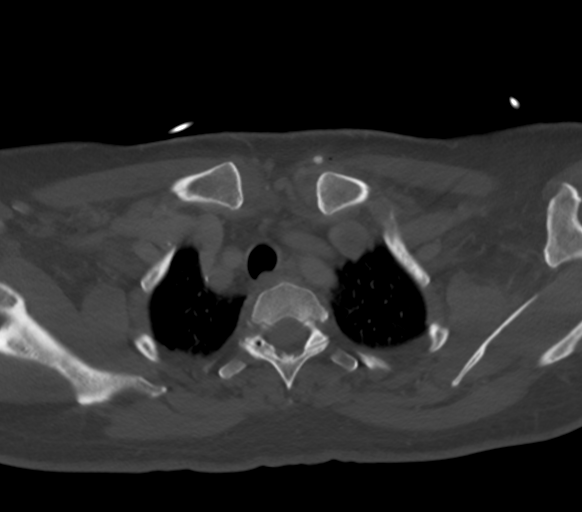
[im 52/103  bone]
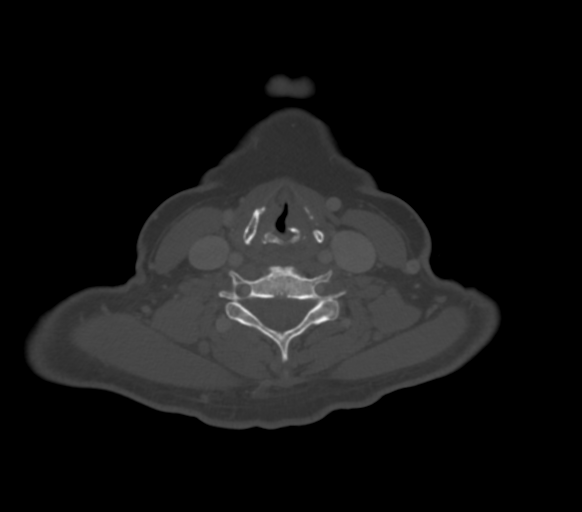

[13 of 33 positions shown; findings below may reference images not displayed]

FINDINGS: Pharynx and larynx: Mildly enlarged and heterogeneous palatine
tonsils, suggestive of tonsillitis.

Salivary glands: No inflammation, mass, or stone.

Thyroid: Approximately 8 mm left thyroid nodule, which is not
require further imaging follow-up per current guidelines.

Lymph nodes: None enlarged or abnormal density.

Vascular: Limited evaluation due to non arterial timing with major
arteries in the neck appearing grossly patent.

Limited intracranial: Negative.

Visualized orbits: Negative.

Mastoids and visualized paranasal sinuses: Clear.

Skeleton: C6-C7 segmentation anomaly with adjacent level C5-C6
degenerative change.

Upper chest: Visualized lung apices are clear.
IMPRESSION: 1. Mildly enlarged and heterogeneous palatine tonsils, which may
reflect tonsillitis. Recommend correlation with direct inspection.
No discrete drainable fluid collection.
2. Medialized left vocal cord with anteromedial deviation of the
arytenoid cartilage, concerning for left vocal cord paralysis. No
evidence of a mass along the expected course of the recurrent
laryngeal in the neck; however, the AP window is incompletely imaged
on this study. Recommend non urgent follow-up chest CT with contrast
to fully characterize.
3. C6-C7 segmentation anomaly with adjacent level C5-C6 degenerative
change.

## 2021-03-09 IMAGING — CT CT HEAD W/O CM
4 series · 17 of 47 positions shown, 19 images · non-contrast
Comparison: None.

CLINICAL DATA: Neuro deficit, acute, stroke suspected

EXAM:
CT HEAD WITHOUT CONTRAST
TECHNIQUE: Contiguous axial images were obtained from the base of the skull
through the vertex without intravenous contrast.

[Series 2: head wo · axial · 0.45mm/px · z∈[-354,-239]mm · 7 of 31 slices shown, 9 images]
[im 4/31  brain]
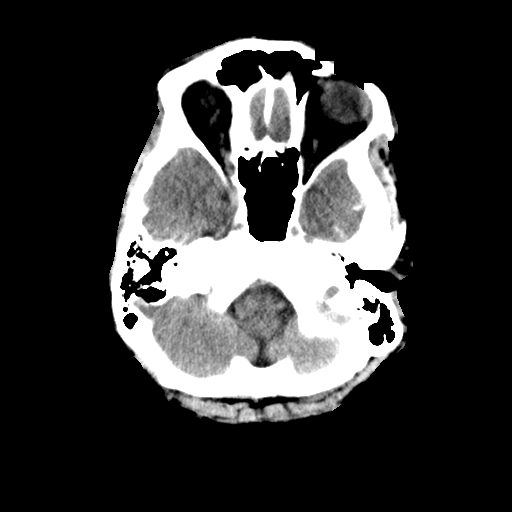
[im 4/31  bone]
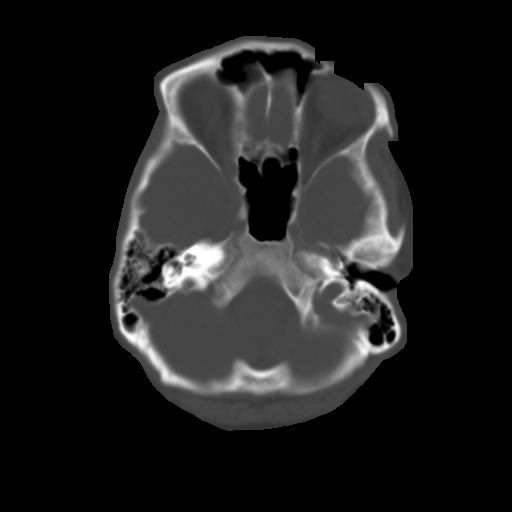
[im 8/31  brain]
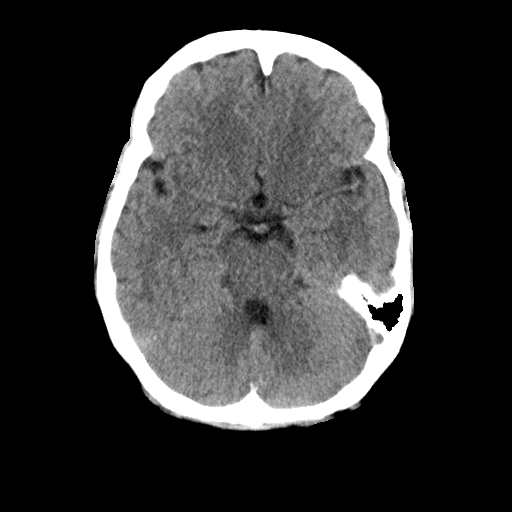
[im 12/31  brain]
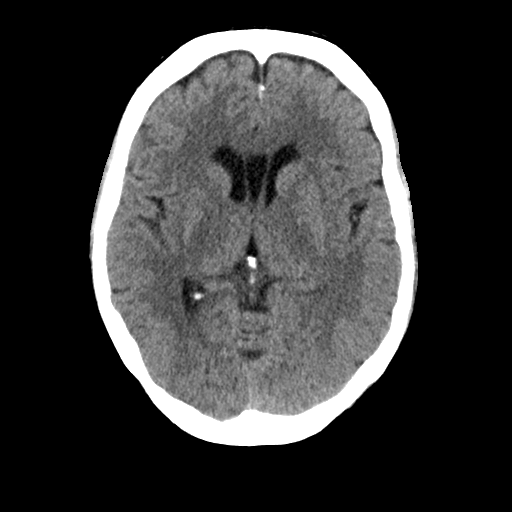
[im 16/31  brain]
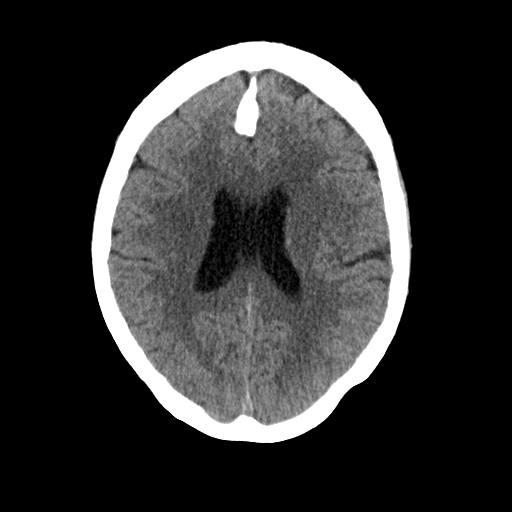
[im 19/31  brain]
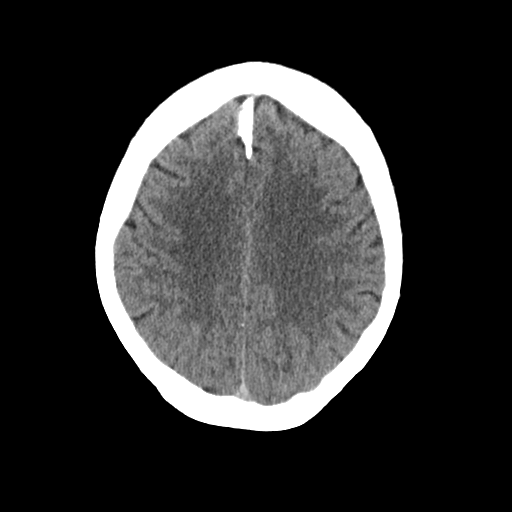
[im 19/31  bone]
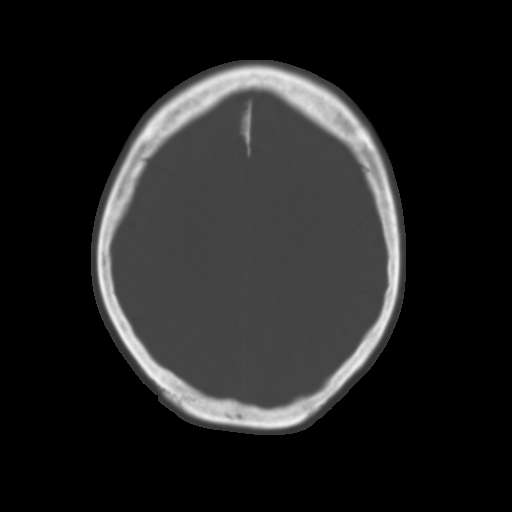
[im 23/31  brain]
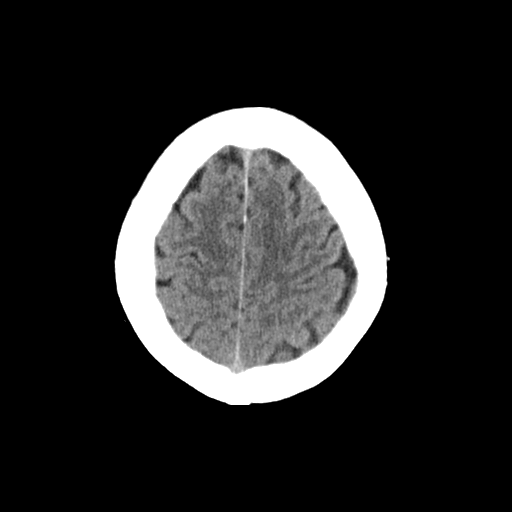
[im 27/31  brain]
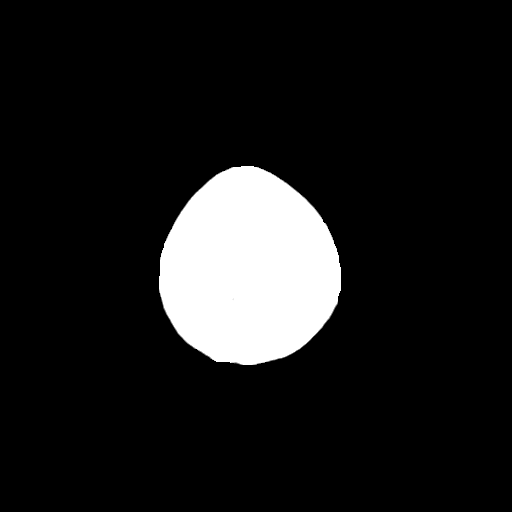

[Series 3: head bone · axial · 0.45mm/px · z∈[-355,-303]mm · 4 of 76 slices shown]
[im 8/76  bone]
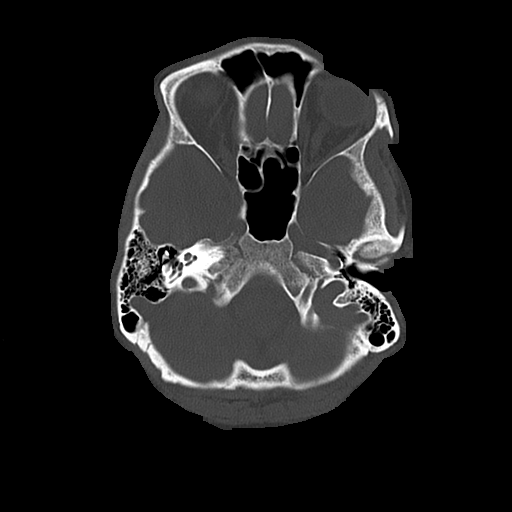
[im 16/76  bone]
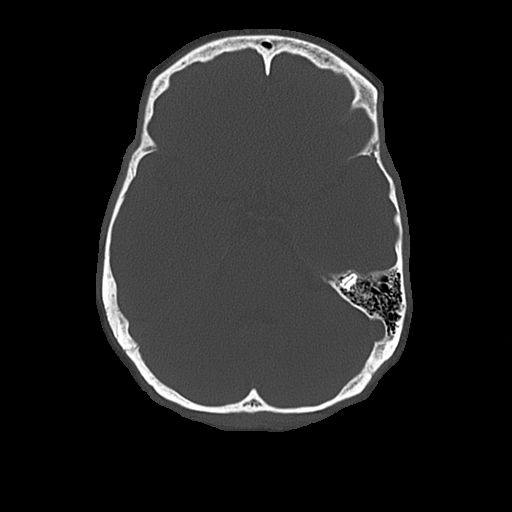
[im 23/76  bone]
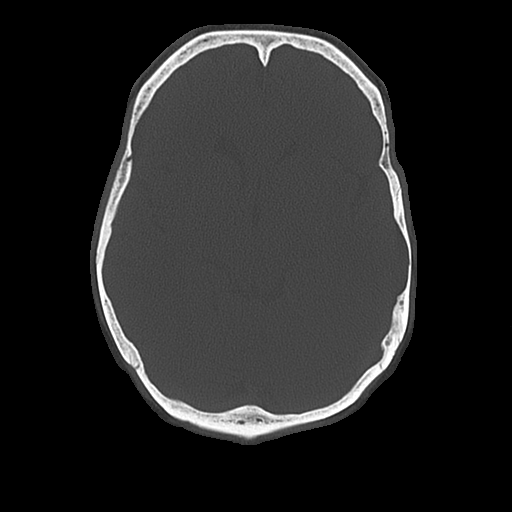
[im 34/76  bone]
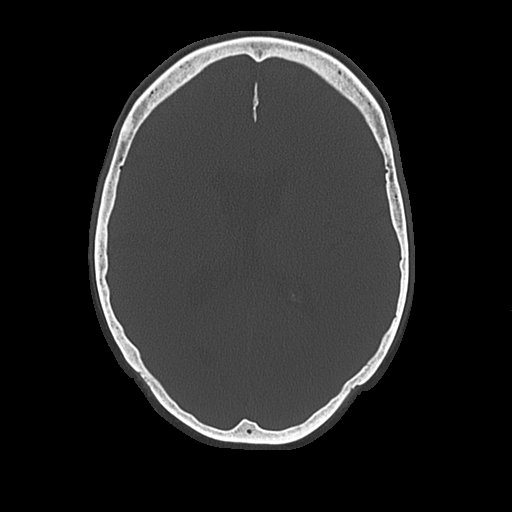

[Series 4: coronal soft · coronal · 0.31mm/px · 3 of 67 slices shown]
[im 23/67  brain]
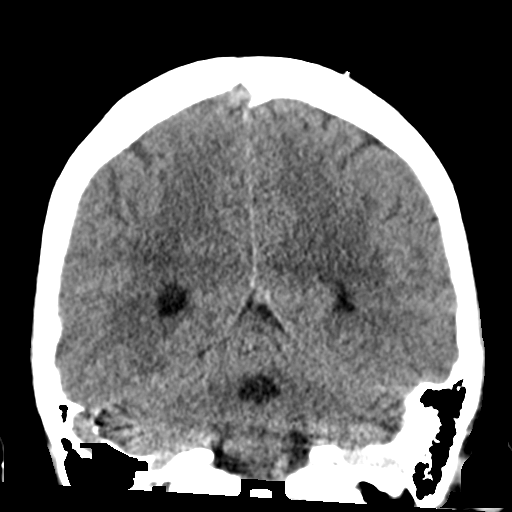
[im 30/67  brain]
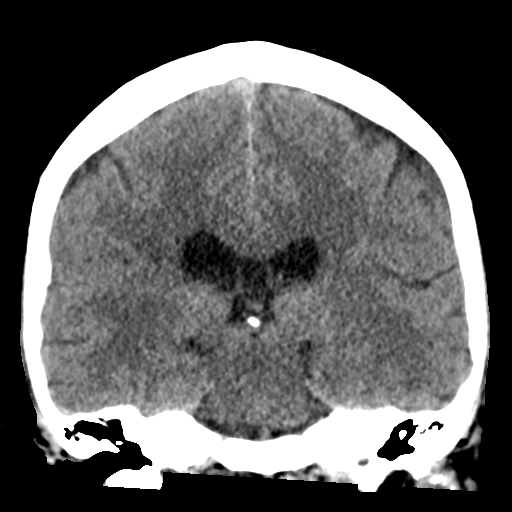
[im 37/67  brain]
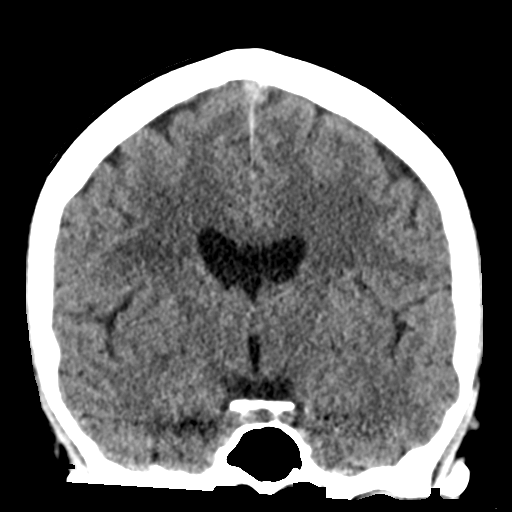

[Series 5: sagittal soft · sagittal · 0.31mm/px · 3 of 54 slices shown]
[im 18/54  brain]
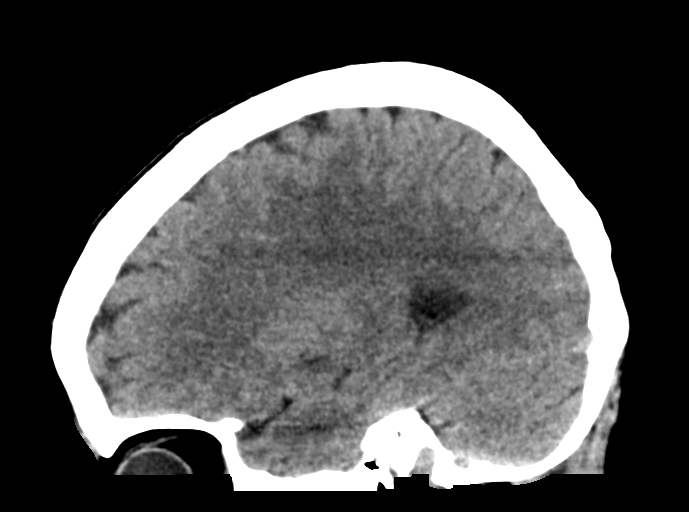
[im 27/54  brain]
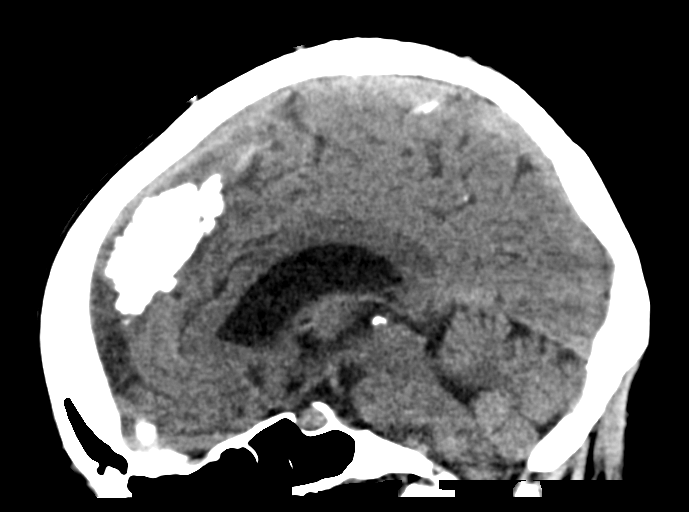
[im 36/54  brain]
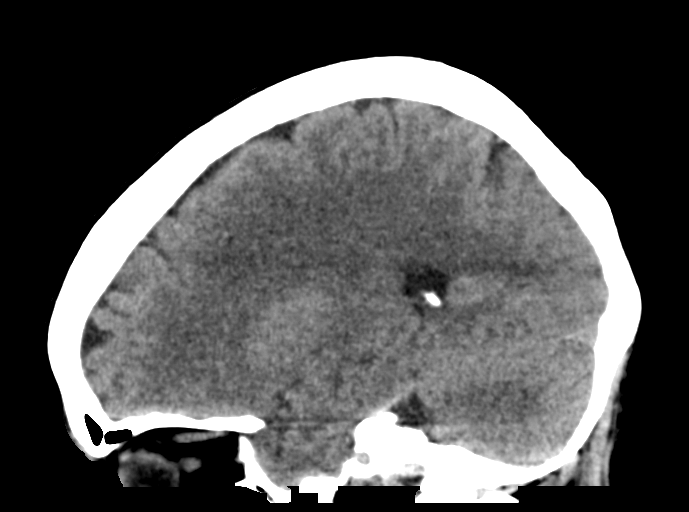

[17 of 47 positions shown; findings below may reference images not displayed]

FINDINGS: Brain: No acute intracranial abnormality. Specifically, no
hemorrhage, hydrocephalus, mass lesion, acute infarction, or
significant intracranial injury.

Vascular: No hyperdense vessel or unexpected calcification.

Skull: No acute calvarial abnormality.

Sinuses/Orbits: No acute findings

Other: None
IMPRESSION: No acute intracranial abnormality.

## 2021-03-09 MED ORDER — GADOBUTROL 1 MMOL/ML IV SOLN
7.0000 mL | Freq: Once | INTRAVENOUS | Status: AC | PRN
  Administered 2021-03-09: 7 mL via INTRAVENOUS

## 2021-03-09 MED ORDER — ACETAMINOPHEN 500 MG PO TABS
1000.0000 mg | ORAL_TABLET | Freq: Once | ORAL | Status: DC
  Filled 2021-03-09: qty 2

## 2021-03-09 MED ORDER — IOHEXOL 300 MG/ML  SOLN
75.0000 mL | Freq: Once | INTRAMUSCULAR | Status: AC | PRN
  Administered 2021-03-09: 75 mL via INTRAVENOUS

## 2021-03-09 NOTE — ED Provider Notes (Signed)
Physical Exam  BP (!) 197/104 (BP Location: Left Arm)   Pulse 78   Temp 98.6 F (37 C) (Oral)   Resp 16   Ht 5\' 3"  (1.6 m)   Wt 72.6 kg   LMP  (LMP Unknown)   SpO2 96%   BMI 28.34 kg/m   Physical Exam  ED Course/Procedures     Procedures  MDM  Patient arrived from [facility] to see department in order to obtain MRI brain head and neck.  According to prior providers note, looks like patient had an episode where she felt weird like she could not swallow and felt like she might choke.  She also had taken a shower and felt like her arm was unable to be riced up, feeling some numbness around her left shoulder to the axilla.  Patient arrived to [facility] via POV in order to obtain MRI brain.  During her stay here, she has been walking around the nurses station, states that she has not anything to eat in several hours and is requesting food at this time.  We did discuss waiting on test prior to providing her food.  Her gait is steady,  she is answering questions adequately.   10:58 PM Spoke to Neurology Dr. [facility] is unable to look at images at the moment, will touch base once MRI report has been processed.   11:30 PM call placed to radiologist due to MRI read a delay, patient is needing to find out disposition as she has a flight at 5 AM.  After speaking to radiologist, he reports some chronic microvascular disease, some perhaps demyelination disease also noted, will likely need an short an MRI repeat in the next 2 months.  These results will be given to patient.  11:47 PM Spoke to Dr. Derry Lory who agrees with radiologist read on exam.  Up on reporting findings of MRI to patient, she reports her hoarseness has worsened, she feels like she is breaking out in hives.  Now she also reports she has a headache. I have personally asked for neurology to evaluate patient in the ED.   MRI BRAIN/CERVICAL spine:  MRI HEAD IMPRESSION:     1. Patchy and hazy T2/FLAIR hyperintensity  involving the  supratentorial cerebral white matter as above, nonspecific, but  overall mild to moderate in nature. While these findings are  nonspecific, changes of chronic microvascular ischemic disease are  favored. Possible demyelinating disease is not entirely excluded,  although appearance would be atypical for this process. No  convincing changes of active demyelination.  2. Punctate 3 mm focus of enhancement involving the inferior left  basal ganglia. Finding is of uncertain etiology or significance, and  could reflect a small vascular structure. Outpatient neurology  follow-up with short interval follow-up MRI in 2-3 months to  document stability recommended.  3. Otherwise negative brain MRI for age. No other acute intracranial  abnormality.     MRI CERVICAL SPINE IMPRESSION:     1. Normal MRI appearance of the cervical spinal cord. No evidence  for demyelinating disease.  2. Multilevel degenerative spondylosis with resultant mild spinal  stenosis at C5-6. Associated mild-to-moderate right C4, C5, C6, and  C8 foraminal stenosis, with moderate left C6 foraminal narrowing.      Patient care signed out to incoming provider.    Portions of this note were generated with Derry Lory. Dictation errors may occur despite best attempts at proofreading.    Scientist, clinical (histocompatibility and immunogenetics), PA-C 03/09/21 [id number]  Wynetta Fines, MD 03/10/21 [phone]

## 2021-03-09 NOTE — Consult Note (Addendum)
NEUROLOGY CONSULTATION NOTE   Date of service: March 10, 2021 Patient Name: Alexis Kerr MRN:  [id number] DOB:  1961/03/02 Reason for consult: "hoarse voice and L sided weakness" Requesting Provider: Marily Memos, MD _ _ _   _ __   _ __ _ _  __ __   _ __   __ _  History of Present Illness  Alexis Kerr is a 60 y.o. female with PMH significant for undiagnosed HTN, Kidney stones who is visiting from [place] and woke up last night at 0200 and felt weird with a sore throat. This morning she had a hoarse voice along with inability to lift her left arm above her head. She choked on her coffee and this has gotten worse over the last several hours.  She was initially seen in the ED at [facility]. She was transferred here for further workup with MRI Brain and C spine which were negative for an acute stroke but did demonstrate white matter disease most suggestive of chronic microvascular disease, difficult to exclude demyelinating disease. She also had a CT neck soft tissue which demonstrated mildly enlarged and geterogenous palatine tonsils which may reflect tonsillitis. 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   mRS: 0 LKW: 0200 tPA: outside window Thrombectomy: No LVO NIHSS components Score: Comment  1a Level of Conscious 0[x]  1[]  2[]  3[]      1b LOC Questions 0[x]  1[]  2[]       1c LOC Commands 0[x]  1[]  2[]       2 Best Gaze 0[x]  1[]  2[]       3 Visual 0[x]  1[]  2[]  3[]      4 Facial Palsy 0[x]  1[]  2[]  3[]      5a Motor Arm - left 0[x]  1[]  2[]  3[]  4[]  UN[]    5b Motor Arm - Right 0[x]  1[]  2[]  3[]  4[]  UN[]    6a Motor Leg - Left 0[x]  1[]  2[]  3[]  4[]  UN[]    6b Motor Leg - Right 0[x]  1[]  2[]  3[]  4[]  UN[]    7 Limb Ataxia 0[x]  1[]  2[]  3[]  UN[]     8 Sensory 0[x]  1[]  2[]  UN[]      9 Best Language 0[x]  1[]  2[]  3[]      10  Dysarthria 0[x]  1[]  2[]  UN[]      11 Extinct. and Inattention 0[x]  1[]  2[]       TOTAL: 0      ROS   Constitutional Denies weight loss, fever and chills.   HEENT Denies changes in vision and hearing.   Respiratory Denies SOB and cough.   CV Denies palpitations and CP   GI Denies abdominal pain, nausea, vomiting and diarrhea.   GU Denies dysuria and urinary frequency.   MSK Denies myalgia and joint pain.   Skin Denies rash and pruritus.   Neurological Denies headache and syncope.   Psychiatric Denies recent changes in mood. Denies anxiety and depression.    Past History   Past Medical History:  Diagnosis Date   Kidney stone on left side    Family History  Problem Relation Age of Onset   Stroke Paternal Grandfather    Social History   Socioeconomic History   Marital status: Married    Spouse name: Not on file   Number of children: Not on file   Years of education: Not on file   Highest education level: Not on file  Occupational  History   Not on file  Tobacco Use   Smoking status: Never   Smokeless tobacco: Never  Substance and Sexual Activity   Alcohol use: Not on file   Drug use: Never   Sexual activity: Not on file  Other Topics Concern   Not on file  Social History Narrative   [place] in [place] and visiting family here   Social Determinants of Health   Financial Resource Strain: Not on file  Food Insecurity: Not on file  Transportation Needs: Not on file  Physical Activity: Not on file  Stress: Not on file  Social Connections: Not on file   Allergies  Allergen Reactions   Cephalexin    Sulfa Antibiotics    Tape     PAPER TAPE ONLY   Bacitracin Rash   Latex Rash    Medications  (Not in a hospital admission)    Vitals   Vitals:   03/09/21 1444 03/09/21 1640 03/09/21 1700 03/09/21 2015  BP: (!) 183/103 (!) 159/99 (!) 190/102 (!) 197/104  Pulse: 80 82 76 78  Resp: 18 12 17 16   Temp:      TempSrc:      SpO2: 99% 98% 97% 96%   Weight:      Height:         Body mass index is 28.34 kg/m.  Physical Exam   General: Laying comfortably in bed; in no acute distress.  HENT: Normal oropharynx and mucosa. Normal external appearance of ears and nose.  Neck: Supple, no pain or tenderness  CV: No JVD. No peripheral edema.  Pulmonary: Symmetric Chest rise. Normal respiratory effort.  Abdomen: Soft to touch, non-tender.  Ext: No cyanosis, edema, or deformity  Skin: No rash. Normal palpation of skin.   Musculoskeletal: Normal digits and nails by inspection. No clubbing.   Neurologic Examination  Mental status/Cognition: Alert, oriented to self, place, month and year, good attention.  Speech/language: Soft and hoarse, fluent, comprehension intact, object naming intact, repetition intact.  Cranial nerves:   CN II Pupils equal and reactive to light, no VF deficits    CN III,IV,VI EOM intact, no gaze preference or deviation, no nystagmus    CN V normal sensation in V1, V2, and V3 segments bilaterally    CN VII no asymmetry, no nasolabial fold flattening    CN VIII normal hearing to speech    CN IX & X normal palatal elevation, no uvular deviation    CN XI 5/5 head turn and shoulder shrug is 5/5 on the Right and 4+/5 on the left.   CN XII midline tongue protrusion   Motor:  Muscle bulk: normal, tone normal, pronator drift none tremor none Mvmt Root Nerve  Muscle Right Left Comments  SA C5/6 Ax Deltoid     EF C5/6 Mc Biceps 5 5   EE C6/7/8 Rad Triceps 5 5   WF C6/7 Med FCR     WE C7/8 PIN ECU     F Ab C8/T1 U ADM/FDI 5 5   HF L1/2/3 Fem Illopsoas 5 5   KE L2/3/4 Fem Quad 5 5   DF L4/5 D Peron Tib Ant 5 5   PF S1/2 Tibial Grc/Sol 5 5    Reflexes:  Right Left Comments  Pectoralis      Biceps (C5/6) 2 2   Brachioradialis (C5/6) 2 2    Triceps (C6/7) 2 2    Patellar (L3/4) 2 2    Achilles (S1) 0 0  Hoffman      Plantar mute mute   Jaw jerk    Sensation:  Light touch Intact throughout   Pin prick     Temperature    Vibration   Proprioception    Coordination/Complex Motor:  - Finger to Nose intact BL - Heel to shin intact BL - Rapid alternating movement are normal - Gait: Stride length short. Arm swing normal. Base width normal  Labs   CBC:  Recent Labs  Lab 03/09/21 1431  WBC 7.3  NEUTROABS 4.5  HGB 14.0  HCT 41.0  MCV 87.4  PLT 246    Basic Metabolic Panel:  Lab Results  Component Value Date   NA 141 03/09/2021   K 3.8 03/09/2021   CO2 27 03/09/2021   GLUCOSE 94 03/09/2021   BUN 15 03/09/2021   CREATININE 0.72 03/09/2021   CALCIUM 9.6 03/09/2021   GFRNONAA >60 03/09/2021   Lipid Panel: No results found for: LDLCALC HgbA1c: No results found for: HGBA1C Urine Drug Screen: No results found for: LABOPIA, COCAINSCRNUR, LABBENZ, AMPHETMU, THCU, LABBARB  Alcohol Level No results found for: ETH  CT Head without contrast: CTH was negative for a large hypodensity concerning for a large territory infarct or hyperdensity concerning for an ICH   CT angio Head and Neck with contrast: pending  MRI HEAD IMPRESSION:   1. Patchy and hazy T2/FLAIR hyperintensity involving the supratentorial cerebral white matter as above, nonspecific, but overall mild to moderate in nature. 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 Possible demyelinating disease is not entirely excluded, although appearance would be atypical for this process. No convincing changes of active demyelination. 2. Punctate 3 mm focus of enhancement involving the inferior left basal ganglia. Finding is of uncertain etiology or significance, and could reflect a small vascular structure. Outpatient neurology follow-up with short interval follow-up MRI in 2-3 months to document stability recommended. 3. Otherwise negative brain MRI for age. No other acute intracranial abnormality.   MRI CERVICAL SPINE IMPRESSION:   1. Normal MRI appearance of the  cervical spinal cord. No evidence for demyelinating disease. 2. 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 Associated mild-to-moderate right C4, C5, C6, and C8 foraminal stenosis, with moderate left C6 foraminal narrowing.  Impression   Alexis Kerr is a 60 y.o. female with PMH significant for undiagnosed HTN(noted on several visits to ED in the past), Kidney stones who is visiting from [place] and woke up last night at 0200 with hoarse voice and decreased shoulder shrug on the left. 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 Her symptoms could be explained by a small lacunar stroke involving the medulla oblongata with involvement of the nucleus ambiguus(wallenburg syndrome) and extending inferiorly into the spinal accessory nucleus which lies directly beneath it and supplies the trapezius. An isolated recurrent laryngeal nerve palsy is unlikely to explain the weakness of shoulder shrug. She also does not have scapular winging concerning for involvement of the long thoracic nerve.  I think the most likely explanation of her symptoms is a tiny stroke in the medulla involving nucleus ambiguus and the spinal accessory nucleus which lies directly beneath it. MRI Brain can sometimes miss these tiny strokes in between the slices. I do not see another possible place where a single lesion could explain her symptoms.  Primary Diagnosis:  Other cerebral infarction due to occlusion of stenosis of small artery.  Secondary Diagnosis: Essential (primary) hypertension  Recommendations  Plan:  Recommend  that primary team order following: - Frequent Neuro checks per stroke unit protocol - Recommend Vascular imaging with MRA Angio Head without contrast and US Carotid doppler - Recommend obtaining TTE - Recommend obtaining Lipid panel with LDL - Please start statin if LDL > 70 - Recommend HbA1c - Antithrombotic - aspirin 81mg  daily along with plavix 75mg  daily  x 21 days, followed by Aspirin 81mg  daily alone. - Recommend DVT ppx - SBP goal - permissive hypertension first 24 h < 220/110. Held home meds.  - Recommend Telemetry monitoring for arrythmia - Recommend bedside swallow screen prior to PO intake. - Stroke education booklet - Recommend PT/OT/SLP consult - Would also recommend repeating MRI Brain w and w/o C in a couple months as requested by [facility], specially to monitor the 61mm focus of enhancement in the inferior left basal ganglia but also to see if there is any progression of the noted white matter as their appearance is a little atypical.  ______________________________________________________________________   Thank you for the opportunity to take part in the care of this patient. If you have any further questions, please contact the neurology consultation attending.  Signed,  [facility] Pager Number _ _ _   _ __   _ __ _ _  __ __   _ __   __ _

## 2021-03-09 NOTE — ED Provider Notes (Signed)
60 year old female received a signout from [place] after the patient was transferred from a dropper into ED for MRI.  Patient is now pending neuro consult.  Patient was initially seen by Dr. Dalene Seltzer.  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 Used bleach wipe cleaning yesterday Has had hx of urticaria in past but no anaphylaxis history and no rash today     Has not seen Dr in a while, suspect has htn"   Physical Exam  BP (!) 198/116   Pulse 72   Temp 98.6 F (37 C) (Oral)   Resp 13   Ht 5\' 3"  (1.6 m)   Wt 72.6 kg   LMP  (LMP Unknown)   SpO2 97%   BMI 28.34 kg/m   Physical Exam Vitals and nursing note reviewed.  Constitutional:      Appearance: She is well-developed.  HENT:     Head: Normocephalic and atraumatic.  Cardiovascular:     Rate and Rhythm: Normal rate.  Pulmonary:     Effort: Pulmonary effort is normal.  Musculoskeletal:        General: Normal range of motion.     Cervical back: Normal range of motion.  Neurological:     Mental Status: She is alert and oriented to person, place, and time.     Comments: Speaks in a whisper.  Shoulder Strug is asymmetric on the left.  Ambulates without ataxia.    ED Course/Procedures     Procedures  MDM   60 year old female received a signout from 60.  Please  see her note for further work-up and medical decision making.  She is pending neurology consult.  I evaluated the patient at bedside along with neurology. There is concern for a tiny stroke in the medulla involving the nucleus ambiguus and the spinal accessory nucleus that was missed on MRI due to it being a small size stroke.  Medical admission recommended for stroke work-up with neurology team following as a consult.  Consult appreciated.  Please see neurology consult note for further recommendations.  Patient reports that she is feeling very dehydrated as she has not had any fluids and was 24 hours.  She has not yet received a bedside stroke swallow screen.  Will order IV fluid infusion.  Consult to the hospitalist service.  Dr. [place] will accept the patient for admission. The patient appears reasonably stabilized for admission considering the current resources, flow, and capabilities available in the ED at this time, and I doubt any other [facility] requiring further screening and/or treatment in the ED prior to admission.      [facility], PA-C 03/10/21 0531    05/11/21, MD 03/10/21 2256

## 2021-03-09 NOTE — ED Provider Notes (Signed)
[facility] EMERGENCY DEPT Provider Note   CSN: [id number] Arrival date & time: 03/09/21  1303     History Chief Complaint  Patient presents with   Dysphagia    Alexis Kerr is a 60 y.o. female.  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 Used bleach wipe cleaning yesterday Has had hx of urticaria in past but no anaphylaxis history and no rash today      Has not seen Dr in a while, suspect has htn   No past medical history on file.  There are no problems to display for this patient.   OB History   No obstetric history on file.     No family history on file.     Home Medications Prior to Admission medications   Medication Sig Start Date End Date Taking? Authorizing Provider  diphenhydrAMINE (BENADRYL) 25 MG tablet Take 50 mg by mouth every 6 (six) hours as needed for allergies.   Yes [provider]  ibuprofen (ADVIL) 200 MG tablet Take 400 mg by mouth every 6 (six) hours as needed for headache.   Yes [provider]  levonorgestrel (MIRENA) 20 MCG/DAY IUD by Intrauterine route. 06/06/14  Yes [provider]    Allergies    Cephalexin, Sulfa antibiotics, Tape, Bacitracin, and Latex  Review of Systems   Review of Systems  Constitutional:  Negative for fever.  HENT:   Positive for trouble swallowing and voice change. Negative for sore throat (does not really feel sore).   Eyes:  Negative for visual disturbance.  Respiratory:  Positive for cough. Negative for shortness of breath.   Cardiovascular:  Negative for chest pain.  Gastrointestinal:  Negative for abdominal pain, nausea and vomiting.  Musculoskeletal:  Negative for arthralgias and neck pain.  Neurological:  Positive for weakness and numbness. Negative for dizziness, facial asymmetry, speech difficulty and headaches.   Physical Exam Updated Vital Signs BP (!) 197/104 (BP Location: Left Arm)   Pulse 78   Temp 98.6 F (37 C) (Oral)   Resp 16   Ht 5\' 3"  (1.6 m)   Wt 72.6 kg   LMP  (LMP Unknown)   SpO2 96%   BMI 28.34 kg/m   Physical Exam Vitals and nursing note reviewed.  Constitutional:      General: She is not in acute distress.    Appearance: Normal appearance. She is well-developed. She is not ill-appearing or diaphoretic.  HENT:     Head: Normocephalic and atraumatic.     Mouth/Throat:     Comments: Hoarse voice Eyes:     General: No visual field deficit.    Extraocular Movements: Extraocular movements intact.     Conjunctiva/sclera: Conjunctivae normal.     Pupils: Pupils are equal, round, and reactive to light.  Cardiovascular:  Rate and Rhythm: Normal rate and regular rhythm.     Pulses: Normal pulses.     Heart sounds: Normal heart sounds. No murmur heard.   No friction rub. No gallop.  Pulmonary:     Effort: Pulmonary effort is normal. No respiratory distress.     Breath sounds: Normal breath sounds. No wheezing or rales.  Musculoskeletal:        General: No swelling or tenderness.     Cervical back: Normal range of motion.  Skin:    General: Skin is warm and dry.     Findings: No erythema or rash.  Neurological:     General: No focal deficit present.     Mental Status: She is alert and oriented to person, place, and time.     GCS: GCS eye subscore is 4. GCS  verbal subscore is 5. GCS motor subscore is 6.     Cranial Nerves: No cranial nerve deficit, dysarthria or facial asymmetry.     Sensory: No sensory deficit.     Motor: No weakness or tremor.     Coordination: Coordination normal. Finger-Nose-Finger Test normal.     Gait: Gait normal.     Comments: Unable to abduct left shoulder past 90 degrees, able to flex, no pronator drift  Altered sensation around shoulder but otherwise Neurologic exam without abnormalities    ED Results / Procedures / Treatments   Labs (all labs ordered are listed, but only abnormal results are displayed) Labs Reviewed  RESP PANEL BY RT-PCR (FLU A&B, COVID) ARPGX2  GROUP A STREP BY PCR  CBC WITH DIFFERENTIAL/PLATELET  COMPREHENSIVE METABOLIC PANEL    EKG EKG Interpretation  Date/Time:  Saturday March 09 2021 13:19:09 EDT Ventricular Rate:  91 PR Interval:  141 QRS Duration: 98 QT Interval:  367 QTC Calculation: 452 R Axis:   74 Text Interpretation: Sinus rhythm Confirmed by Virgina Norfolk ([id number]) on 03/09/2021 3:04:28 PM  Radiology CT Head Wo Contrast  Result Date: 03/09/2021 CLINICAL DATA:  Neuro deficit, acute, stroke suspected EXAM: CT HEAD WITHOUT CONTRAST TECHNIQUE: Contiguous axial images were obtained from the base of the skull through the vertex without intravenous contrast. COMPARISON:  None. FINDINGS: Brain: No acute intracranial abnormality. Specifically, no hemorrhage, hydrocephalus, mass lesion, acute infarction, or significant intracranial injury. Vascular: No hyperdense vessel or unexpected calcification. Skull: No acute calvarial abnormality. Sinuses/Orbits: No acute findings Other: None IMPRESSION: No acute intracranial abnormality. Electronically Signed   By: Charlett Nose M.D.   On: 03/09/2021 16:44   CT Soft Tissue Neck W Contrast  Result Date: 03/09/2021 CLINICAL DATA:  Neck abscess, deep tissue. EXAM: CT NECK WITH CONTRAST TECHNIQUE: Multidetector CT imaging of the neck was performed using  the standard protocol following the bolus administration of intravenous contrast. CONTRAST:  17mL OMNIPAQUE IOHEXOL 300 MG/ML  SOLN COMPARISON:  None. FINDINGS: Pharynx and larynx: Mildly enlarged and heterogeneous palatine tonsils, suggestive of tonsillitis. Salivary glands: No inflammation, mass, or stone. Thyroid: Approximately 8 mm left thyroid nodule, which is not require further imaging follow-up per current guidelines. Lymph nodes: None enlarged or abnormal density. Vascular: Limited evaluation due to non arterial timing with major arteries in the neck appearing grossly patent. Limited intracranial: Negative. Visualized orbits: Negative. Mastoids and visualized paranasal sinuses: Clear. 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 Upper chest: Visualized lung apices are clear. IMPRESSION: 1. Mildly enlarged and heterogeneous palatine tonsils, which may reflect tonsillitis. Recommend correlation with direct inspection. No discrete drainable fluid collection. 2. Medialized  left vocal cord with anteromedial deviation of the arytenoid cartilage, concerning for left vocal cord paralysis. No evidence of a mass along the expected course of the recurrent laryngeal in the neck; however, the AP window is incompletely imaged on this study. Recommend non urgent follow-up chest CT with contrast to fully characterize. 3. 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 Electronically Signed   By: Feliberto Harts MD   On: 03/09/2021 17:17   DG Chest Portable 1 View  Result Date: 03/09/2021 CLINICAL DATA:  Shortness of breath.  Pharyngitis. EXAM: PORTABLE CHEST 1 VIEW COMPARISON:  None. FINDINGS: The heart size and mediastinal contours are within normal limits. Both lungs are clear. The visualized skeletal structures are unremarkable. IMPRESSION: No active disease. Electronically Signed   By: Beckie Salts M.D.   On: 03/09/2021 14:43     Procedures Procedures   Medications Ordered in ED Medications  iohexol (OMNIPAQUE) 300 MG/ML solution 75 mL (75 mLs Intravenous Contrast Given 03/09/21 1609)  gadobutrol (GADAVIST) 1 MMOL/ML injection 7 mL (7 mLs Intravenous Contrast Given 03/09/21 2200)    ED Course  I have reviewed the triage vital signs and the nursing notes.  Pertinent labs & imaging results that were available during my care of the patient were reviewed by me and considered in my medical decision making (see chart for details).    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    I do not see signs of PTA, ludwig's angina on exam.  Doubt anaphylaxis by history and exam.  No dyspnea.  Neurologic exam normal with exception of change in phonation and inability to abduct greater than 90 degrees. Consider possible rotator cuff abnormality, however no significant pain and symptoms began at the same time as difficulty speaking.  CXR ordered to screen for thoracic mass as etiology of both shoulder weakness and dysphonia/dysphagia.  CT head and CT soft tissue neck pending at time of transfer of care to evaluate for other emergent pathology.      Final Clinical Impression(s) / ED Diagnoses Final diagnoses:  Dysphagia, unspecified type  Dysphonia  Decreased abduction of left shoulder joint    Rx / DC Orders ED Discharge Orders     None        Alvira Monday, MD 03/09/21 [id number]

## 2021-03-09 NOTE — ED Provider Notes (Signed)
Patient signed out to me awaiting CT scan of head and neck.  Woke up around 2 AM with difficulty swallowing, hoarse voice, difficulty with abduction of her left arm.  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  She denies any fever or chills.  Symptoms started overnight and did not seem to get much better.  She continues to have a hoarse voice on exam and she does have some difficulty with abduction of her left arm once he gets past 90 degrees.  Otherwise strength and sensation and vision are intact throughout.  Strep test is negative.  COVID and flu test are negative.  No leukocytosis.  CT head is normal.  CT scan of the neck showed some mildly enlarged and heterogeneous pontine tonsils which may reflect a tonsillitis.  There is some redness on exam but no obvious enlargement of the tonsils.  There is no obvious abscess.  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  However there is no obvious mass-effect.  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  Talked with Dr. Pollyann Kennedy with ENT as well as Dr. Amada Jupiter with neurology and overall believe MRI of the head and neck is warranted to rule out stroke or demyelinating process or mass.  We will transfer the patient over to [facility].  Dr. Rodena Medin aware of the patient from the ED standpoint.  Neurology would like to be updated once MRI is back.  If MRI is negative for neurological process would likely follow-up with ENT for further recs.  Patient is visiting from out of town in [place] and is supposed to be leaving tomorrow.  This chart was dictated using voice recognition software.  Despite best efforts to proofread,  errors can occur which can change the documentation meaning.    Virgina Norfolk, DO 03/09/21 1751

## 2021-03-09 NOTE — ED Triage Notes (Signed)
She c/o mild sore throat yesterday evening. She states that at ~0200 today, she noted that she could swallow, however, she notes that her abilitiy to feel herself swallow seems diminished. She is ambulatory and in no distress.

## 2021-03-09 NOTE — ED Notes (Signed)
Dr Lockie Mola in room w/pt now.

## 2021-03-09 NOTE — Discharge Instructions (Addendum)
Continue with pureed diet and advance to solid food as tolerated. Try to take a low sodium (2 gram) diet due to your diagnosis of high blood pressure.   You were cared for by a hospitalist during your hospital stay. If you have any questions about your discharge medications or the care you received while you were in the hospital after you are discharged, you can call the unit and asked to speak with the hospitalist on call if the hospitalist that took care of you is not available. Once you are discharged, your primary care physician will handle any further medical issues.   Please note that NO REFILLS for any discharge medications will be authorized once you are discharged, as it is imperative that you return to your primary care physician (or establish a relationship with a primary care physician if you do not have one) for your aftercare needs so that they can reassess your need for medications and monitor your lab values.  Please take all your medications with you for your next visit with your Primary MD. Please ask your Primary MD to get all Hospital records sent to his/her office. Please request your Primary MD to go over all hospital test results at the follow up.   If you experience worsening of your admission symptoms, develop shortness of breath, chest pain, suicidal or homicidal thoughts or a life threatening emergency, you must seek medical attention immediately by calling 911 or calling your MD.   Bonita Quin must read the complete instructions/literature along with all the possible adverse reactions/side effects for all the medicines you take including new medications that have been prescribed to you. Take new medicines after you have completely understood and accpet all the possible adverse reactions/side effects.    Do not drive when taking pain medications or sedatives.     Do not take more than prescribed Pain, Sleep and Anxiety Medications   If you have smoked or chewed Tobacco in the  last 2 yrs please stop. Stop any regular alcohol  and or recreational drug use.   Wear Seat belts while driving.

## 2021-03-09 NOTE — ED Notes (Signed)
Report called to Asher Muir, RN at [facility] ED

## 2021-03-10 ENCOUNTER — Inpatient Hospital Stay (HOSPITAL_COMMUNITY): Payer: BC Managed Care – PPO

## 2021-03-10 ENCOUNTER — Encounter (HOSPITAL_COMMUNITY): Payer: Self-pay | Admitting: Neurology

## 2021-03-10 ENCOUNTER — Other Ambulatory Visit (HOSPITAL_COMMUNITY): Payer: BC Managed Care – PPO

## 2021-03-10 ENCOUNTER — Other Ambulatory Visit: Payer: Self-pay

## 2021-03-10 DIAGNOSIS — M25612 Stiffness of left shoulder, not elsewhere classified: Secondary | ICD-10-CM | POA: Diagnosis not present

## 2021-03-10 DIAGNOSIS — R490 Dysphonia: Secondary | ICD-10-CM | POA: Diagnosis present

## 2021-03-10 DIAGNOSIS — J3801 Paralysis of vocal cords and larynx, unilateral: Secondary | ICD-10-CM | POA: Diagnosis present

## 2021-03-10 DIAGNOSIS — Z882 Allergy status to sulfonamides status: Secondary | ICD-10-CM | POA: Diagnosis not present

## 2021-03-10 DIAGNOSIS — Z881 Allergy status to other antibiotic agents status: Secondary | ICD-10-CM | POA: Diagnosis not present

## 2021-03-10 DIAGNOSIS — R030 Elevated blood-pressure reading, without diagnosis of hypertension: Secondary | ICD-10-CM | POA: Diagnosis not present

## 2021-03-10 DIAGNOSIS — I6389 Other cerebral infarction: Secondary | ICD-10-CM | POA: Diagnosis not present

## 2021-03-10 DIAGNOSIS — I639 Cerebral infarction, unspecified: Secondary | ICD-10-CM | POA: Diagnosis present

## 2021-03-10 DIAGNOSIS — I10 Essential (primary) hypertension: Secondary | ICD-10-CM | POA: Diagnosis present

## 2021-03-10 DIAGNOSIS — R531 Weakness: Secondary | ICD-10-CM | POA: Diagnosis not present

## 2021-03-10 DIAGNOSIS — E042 Nontoxic multinodular goiter: Secondary | ICD-10-CM | POA: Diagnosis present

## 2021-03-10 DIAGNOSIS — Z91048 Other nonmedicinal substance allergy status: Secondary | ICD-10-CM | POA: Diagnosis not present

## 2021-03-10 DIAGNOSIS — G463 Brain stem stroke syndrome: Secondary | ICD-10-CM | POA: Diagnosis not present

## 2021-03-10 DIAGNOSIS — Z793 Long term (current) use of hormonal contraceptives: Secondary | ICD-10-CM | POA: Diagnosis not present

## 2021-03-10 DIAGNOSIS — J3800 Paralysis of vocal cords and larynx, unspecified: Secondary | ICD-10-CM

## 2021-03-10 DIAGNOSIS — Z20822 Contact with and (suspected) exposure to covid-19: Secondary | ICD-10-CM | POA: Diagnosis present

## 2021-03-10 DIAGNOSIS — R221 Localized swelling, mass and lump, neck: Secondary | ICD-10-CM | POA: Diagnosis present

## 2021-03-10 DIAGNOSIS — E7800 Pure hypercholesterolemia, unspecified: Secondary | ICD-10-CM | POA: Diagnosis present

## 2021-03-10 DIAGNOSIS — R1310 Dysphagia, unspecified: Secondary | ICD-10-CM | POA: Diagnosis present

## 2021-03-10 DIAGNOSIS — Z888 Allergy status to other drugs, medicaments and biological substances status: Secondary | ICD-10-CM | POA: Diagnosis not present

## 2021-03-10 DIAGNOSIS — E860 Dehydration: Secondary | ICD-10-CM | POA: Diagnosis present

## 2021-03-10 DIAGNOSIS — J351 Hypertrophy of tonsils: Secondary | ICD-10-CM | POA: Diagnosis present

## 2021-03-10 DIAGNOSIS — W57XXXA Bitten or stung by nonvenomous insect and other nonvenomous arthropods, initial encounter: Secondary | ICD-10-CM | POA: Diagnosis present

## 2021-03-10 DIAGNOSIS — Z91040 Latex allergy status: Secondary | ICD-10-CM | POA: Diagnosis not present

## 2021-03-10 DIAGNOSIS — E538 Deficiency of other specified B group vitamins: Secondary | ICD-10-CM | POA: Diagnosis present

## 2021-03-10 LAB — ECHOCARDIOGRAM COMPLETE
Area-P 1/2: 4.89 cm2
Height: 63 in
S' Lateral: 3 cm
Weight: 2649.05 oz

## 2021-03-10 LAB — HEMOGLOBIN A1C
Hgb A1c MFr Bld: 5.5 % (ref 4.8–5.6)
Mean Plasma Glucose: 111.15 mg/dL

## 2021-03-10 LAB — HIV ANTIBODY (ROUTINE TESTING W REFLEX): HIV Screen 4th Generation wRfx: NONREACTIVE

## 2021-03-10 LAB — LIPID PANEL
Cholesterol: 209 mg/dL — ABNORMAL HIGH (ref 0–200)
HDL: 68 mg/dL (ref >40)
LDL Cholesterol: 123 mg/dL — ABNORMAL HIGH (ref 0–99)
Total CHOL/HDL Ratio: 3.1 RATIO
Triglycerides: 90 mg/dL (ref <150)
VLDL: 18 mg/dL (ref 0–40)

## 2021-03-10 IMAGING — US US THYROID
1 series · 13 of 25 positions shown · non-contrast
Comparison: CT [DATE]

CLINICAL DATA: Nodule on CT neck

EXAM:
THYROID ULTRASOUND
TECHNIQUE: Ultrasound examination of the thyroid gland and adjacent soft
tissues was performed.

[Series 1: us thyroid · 13 of 84 slices shown]
[im 1/84]
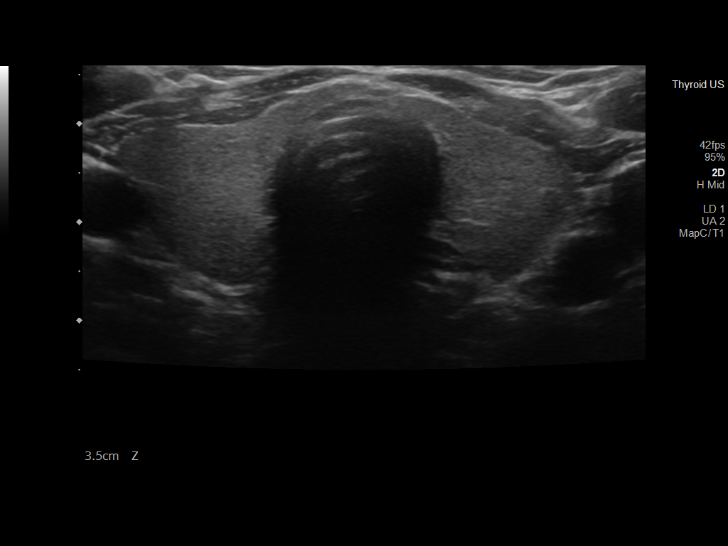
[im 7/84]
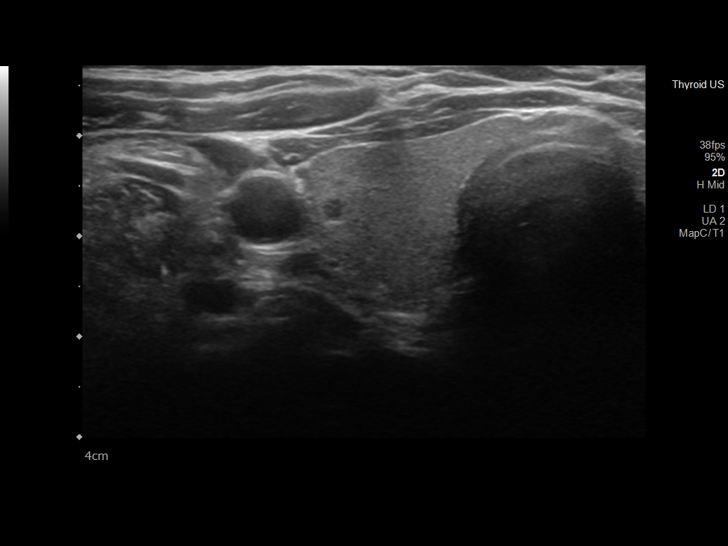
[im 14/84]
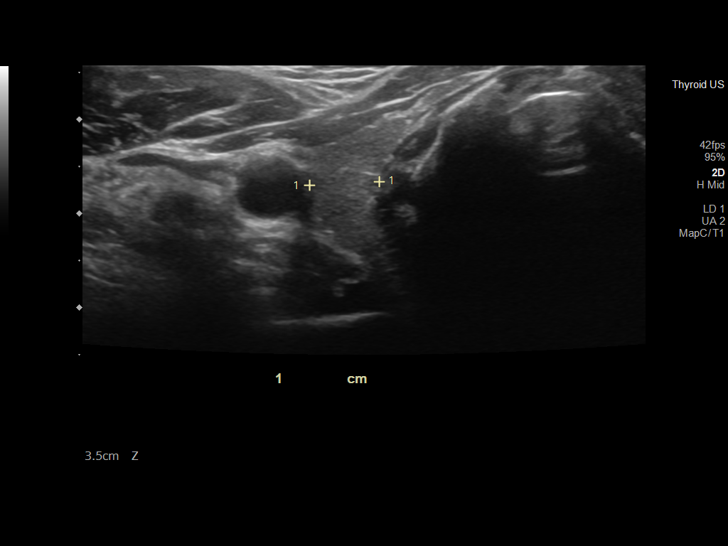
[im 21/84]
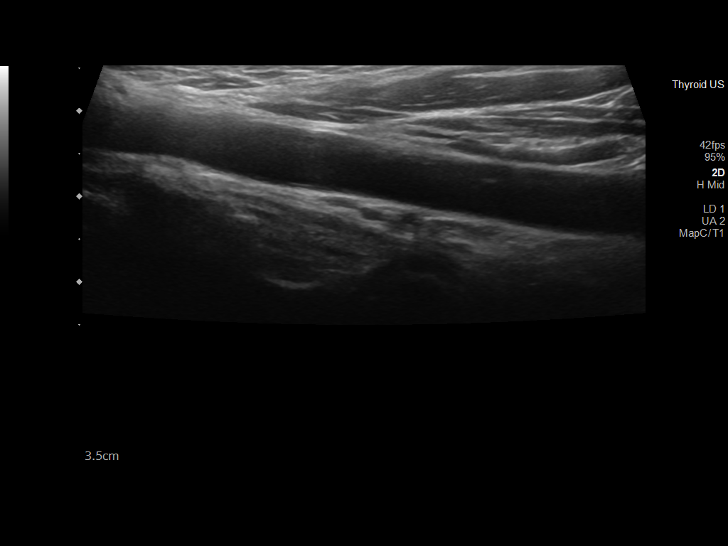
[im 28/84]
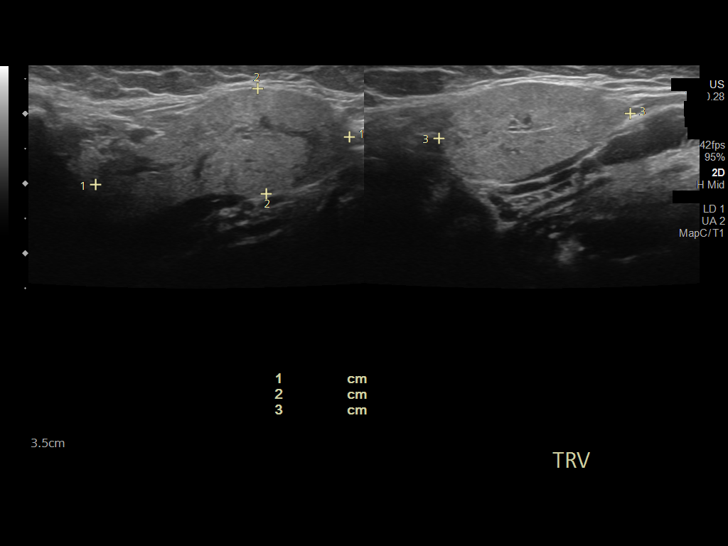
[im 35/84]
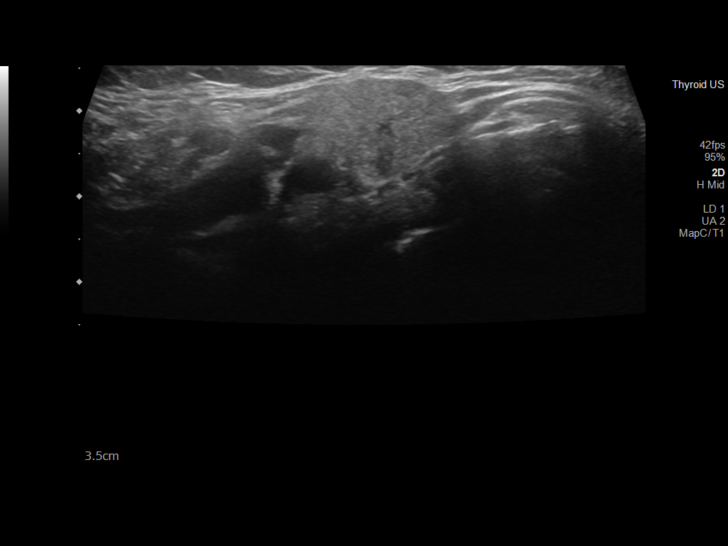
[im 42/84]
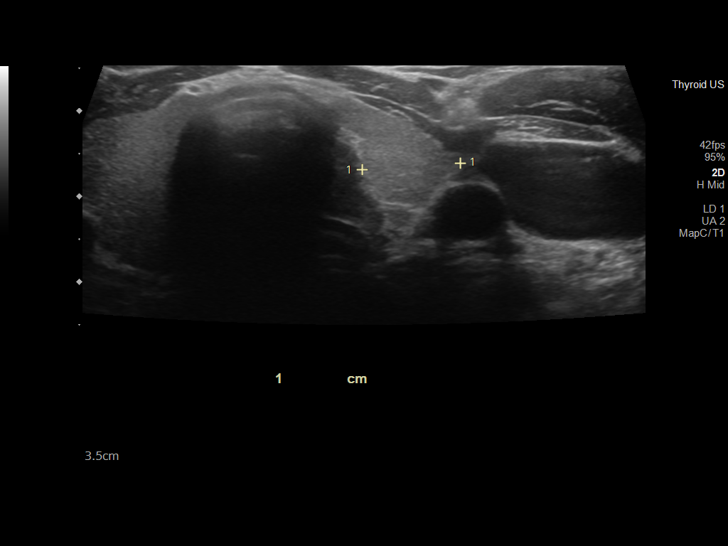
[im 49/84]
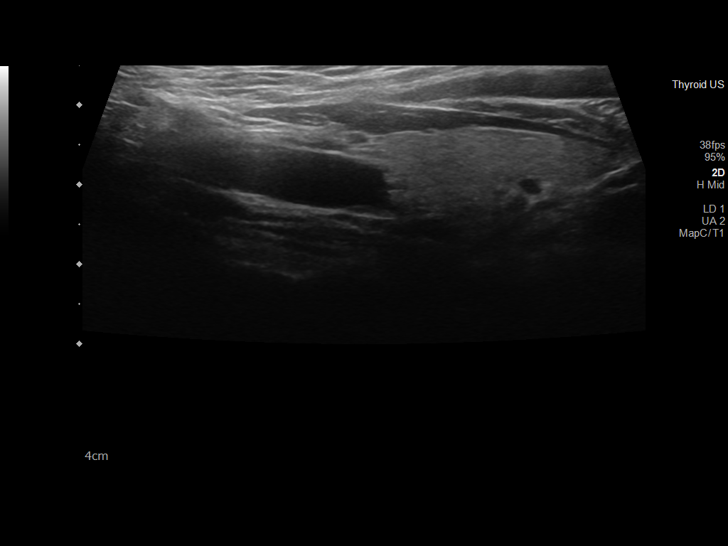
[im 56/84]
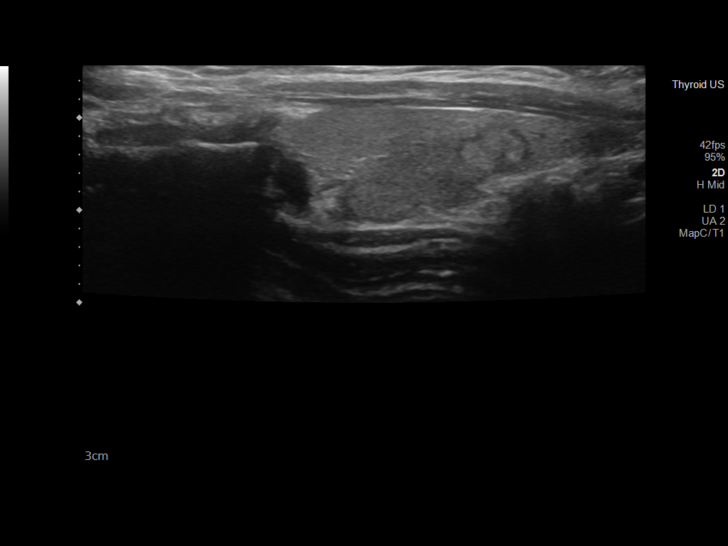
[im 63/84]
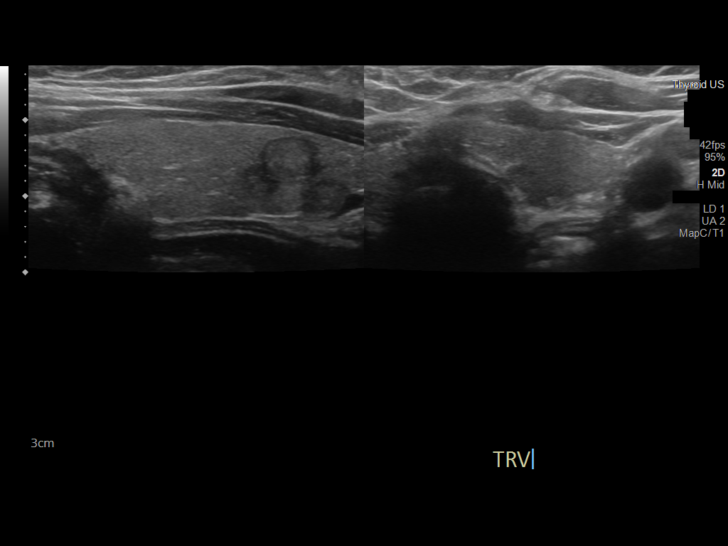
[im 70/84]
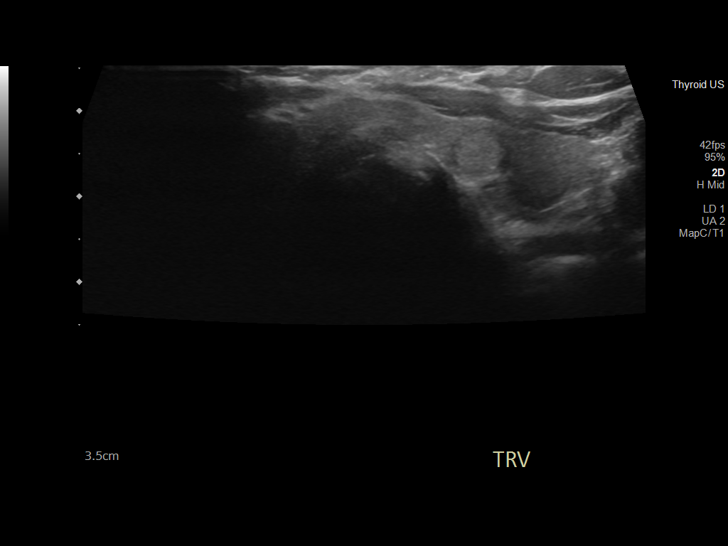
[im 77/84]
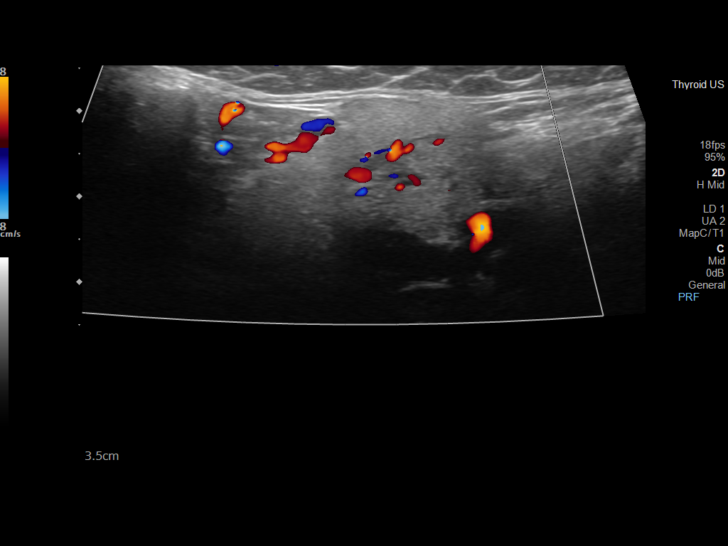
[im 84/84]
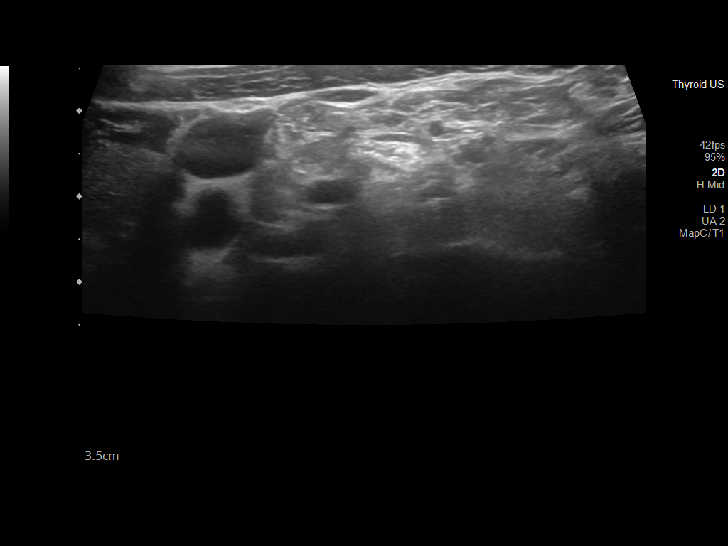

[13 of 25 positions shown; findings below may reference images not displayed]

FINDINGS: Parenchymal Echotexture: Normal

Isthmus: 0.2 cm thickness

Right lobe: 4.5 x 1.8 x 1.5 cm

Left lobe: 3.7 x 1.3 x 1.4 cm

_________________________________________________________

Estimated total number of nodules >/= 1 cm: 0

Number of spongiform nodules >/=  2 cm not described below (TR1): 0

Number of mixed cystic and solid nodules >/= 1.5 cm not described
below (TR2): 0

_________________________________________________________

Nodule # 1: 0.8 cm isoechoic nodule without calcifications, inferior
left; This nodule does NOT meet TI-RADS criteria for biopsy or
dedicated follow-up.

Nodule # 2: 0.7 cm hypoechoic nodule without calcifications,
inferior left; This nodule does NOT meet TI-RADS criteria for biopsy
or dedicated follow-up.

No regional cervical adenopathy identified. Limited images of
submandibular glands unremarkable.
IMPRESSION: 1. Normal-sized thyroid with small left nodules, which do not meet
criteria for biopsy or follow-up.

The above is in keeping with the ACR TI-RADS recommendations - [HOSPITAL] [2S];[DATE].

## 2021-03-10 IMAGING — MR MR MRA HEAD W/O CM
1 series · 25 of 48 positions shown · non-contrast
Comparison: No pertinent prior exam.

CLINICAL DATA: Neuro deficit with stroke suspected

EXAM:
MRA HEAD WITHOUT CONTRAST
TECHNIQUE: Angiographic images of the Circle of Willis were acquired using MRA
technique without intravenous contrast.

[Series 5: 3d cow · axial · 0.5mm · 0.41mm/px · z∈[-128,-31]mm · 25 of 208 slices shown]
[im 1/208]
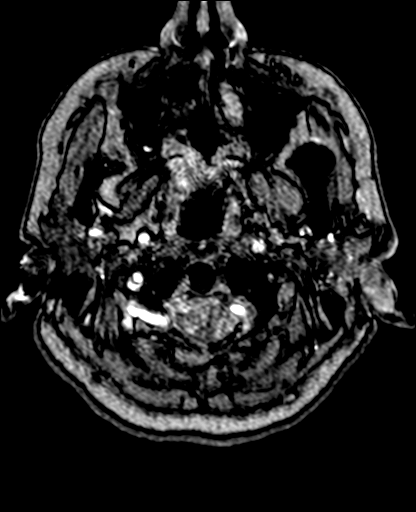
[im 5/208]
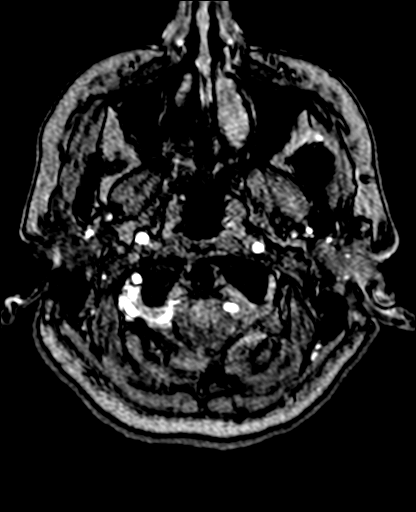
[im 9/208]
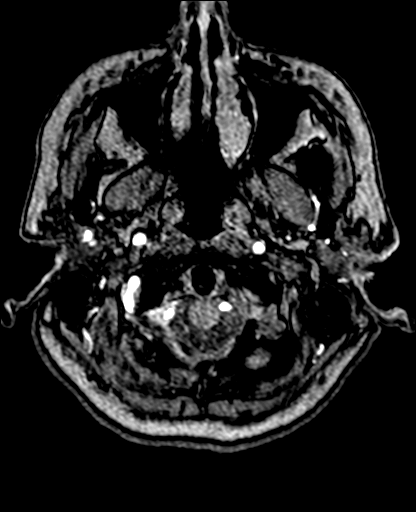
[im 14/208]
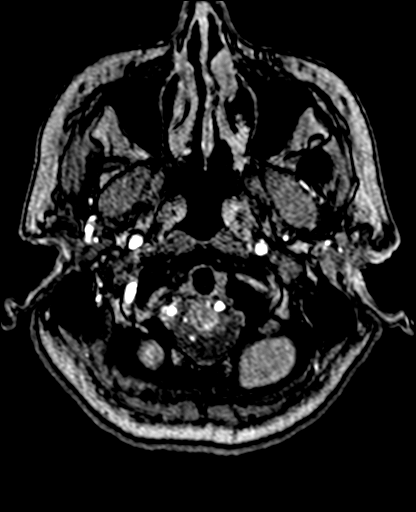
[im 18/208]
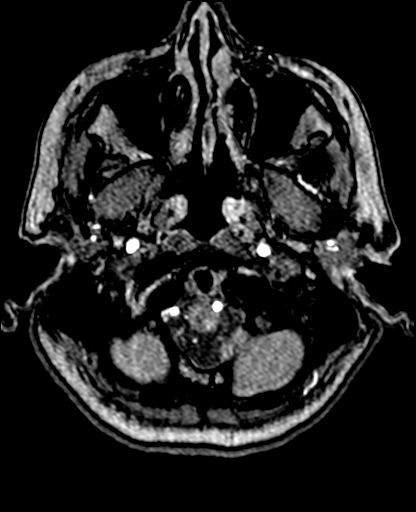
[im 23/208]
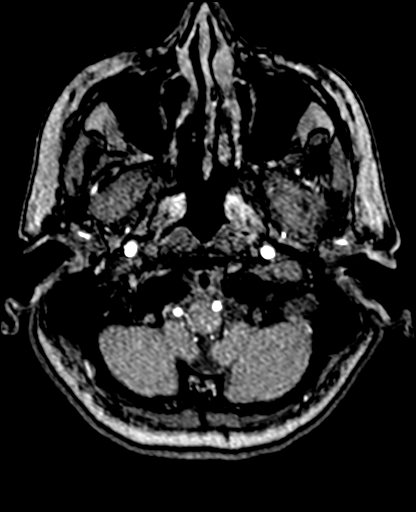
[im 27/208]
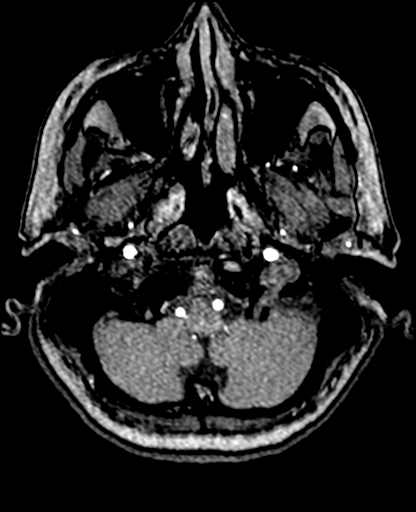
[im 31/208]
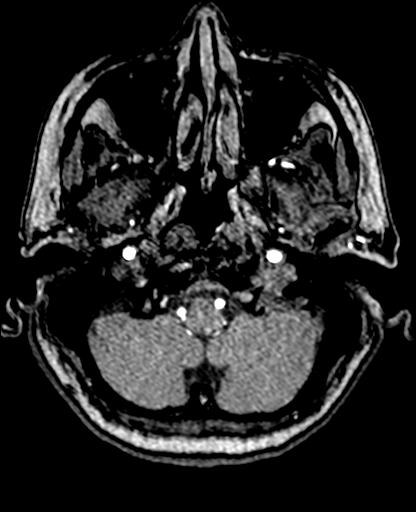
[im 36/208]
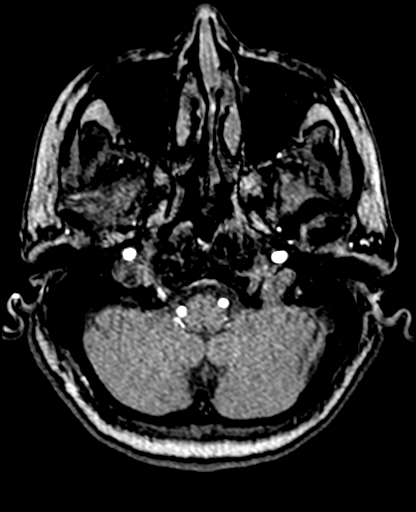
[im 40/208]
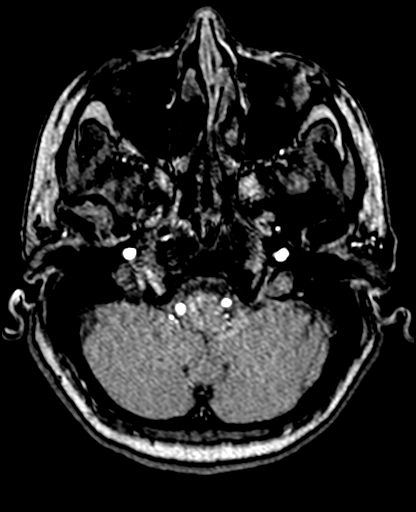
[im 45/208]
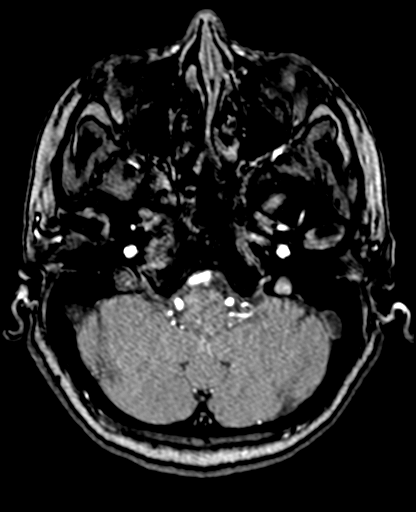
[im 49/208]
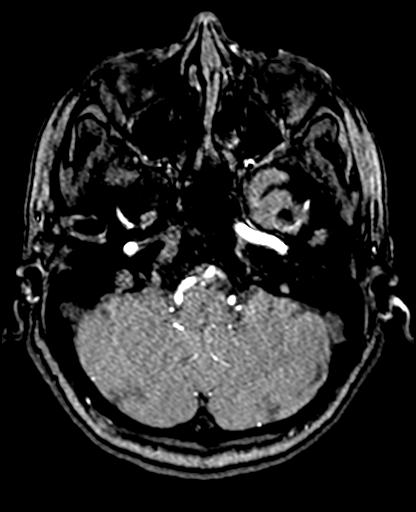
[im 53/208]
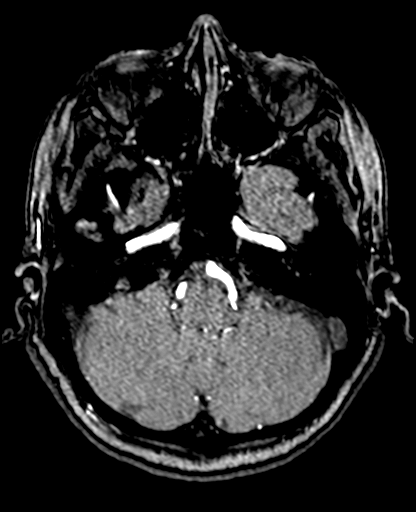
[im 58/208]
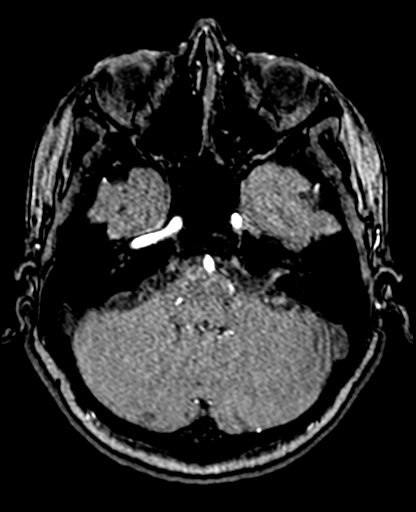
[im 62/208]
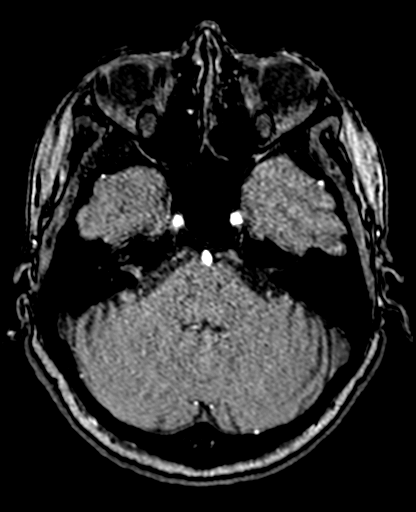
[im 67/208]
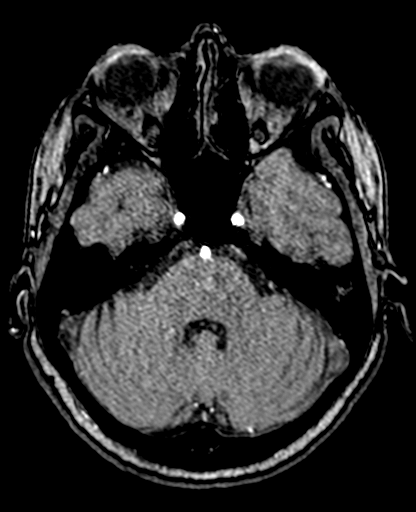
[im 71/208]
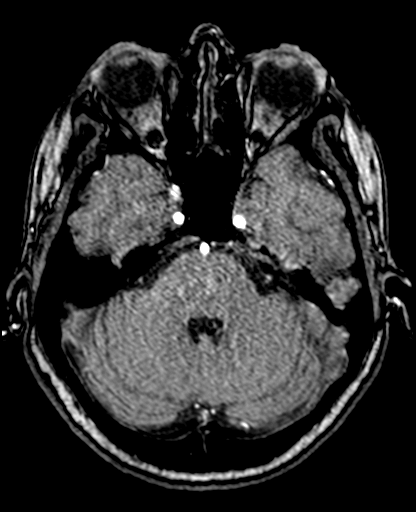
[im 75/208]
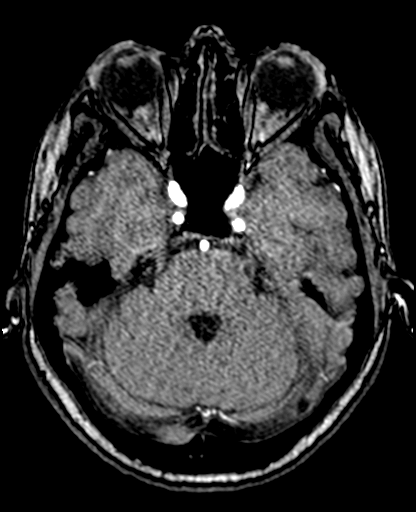
[im 93/208]
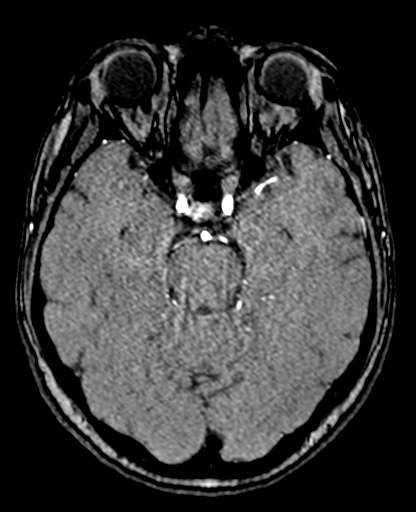
[im 106/208]
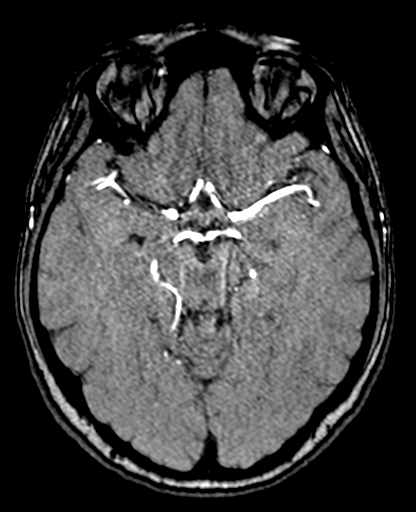
[im 119/208]
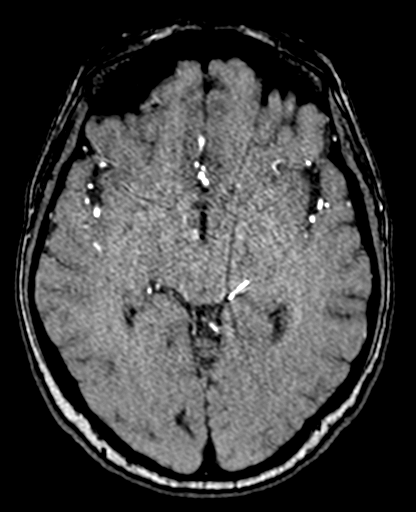
[im 146/208]
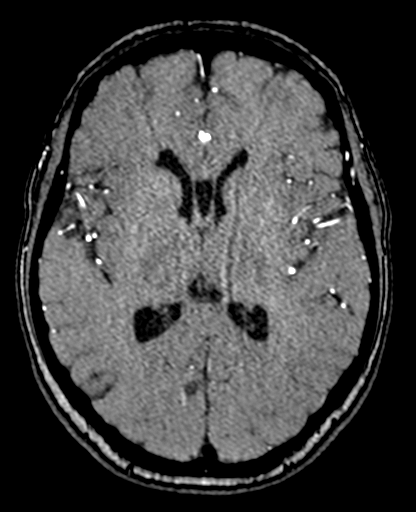
[im 172/208]
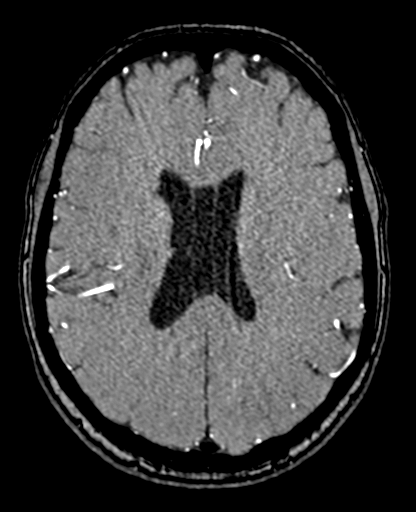
[im 177/208]
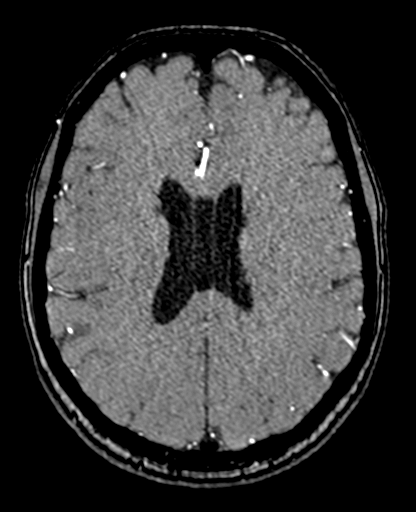
[im 199/208]
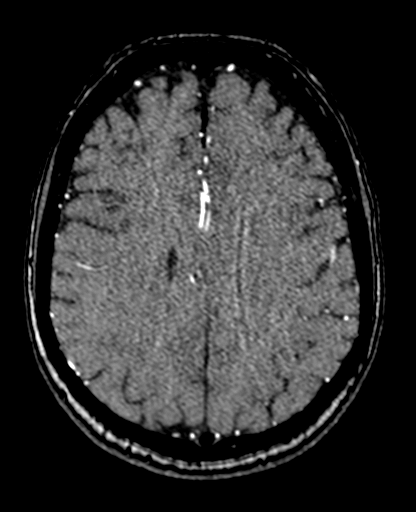

[25 of 48 positions shown; findings below may reference images not displayed]

FINDINGS: Anterior circulation: Vessels are smooth and widely patent. Negative
for aneurysm

Posterior circulation: Vessels are smooth and widely patent.
Negative for aneurysm

Anatomic variants: None significant
IMPRESSION: Normal MRA.

## 2021-03-10 IMAGING — CT CT CHEST W/ CM
2 of 4 series · 15 of 36 positions shown, 18 images · IV contrast (APPLIED)
Comparison: Neck CT of 1 day prior. Chest radiograph of 1 day
prior.

CLINICAL DATA: Upper respiratory illness. Evaluate recurrent
laryngeal nerve. Abnormal left vocal cord on neck CT.

EXAM:
CT CHEST WITH CONTRAST
TECHNIQUE: Multidetector CT imaging of the chest was performed during
intravenous contrast administration.
CONTRAST:  75mL OMNIPAQUE IOHEXOL 300 MG/ML  SOLN

[Series 3: thorax 2.0 i31f 2 · axial · 0.82mm/px · z∈[+985,+1293]mm · 12 of 180 slices shown, 15 images]
[im 13/180  mediastinal]
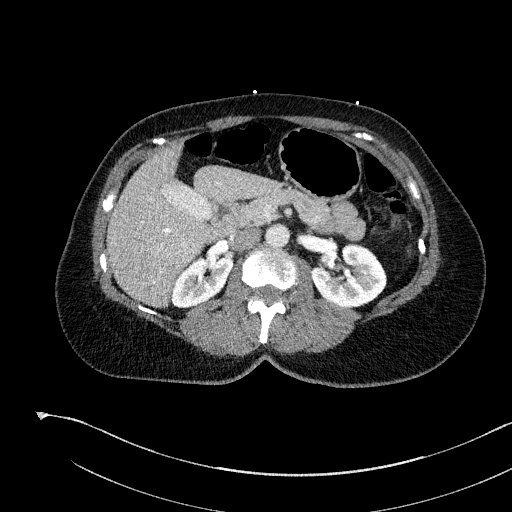
[im 13/180  lung]
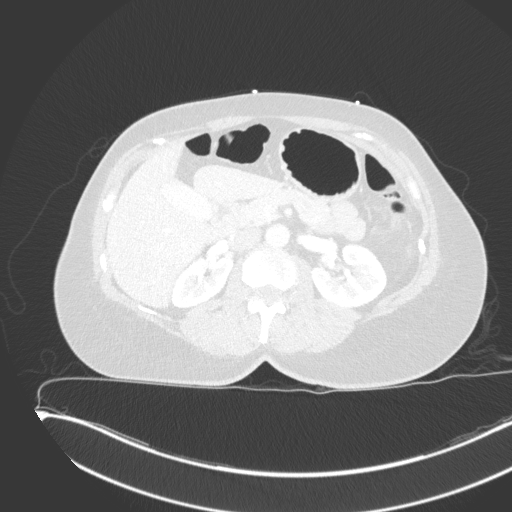
[im 26/180  lung]
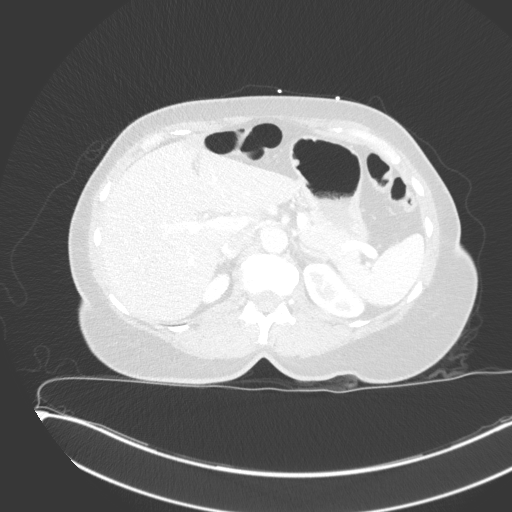
[im 39/180  lung]
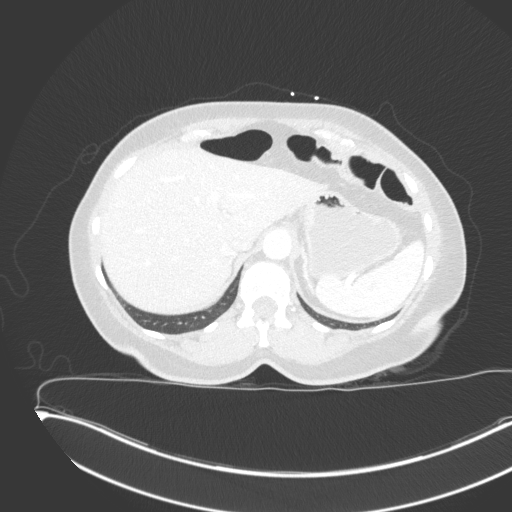
[im 52/180  lung]
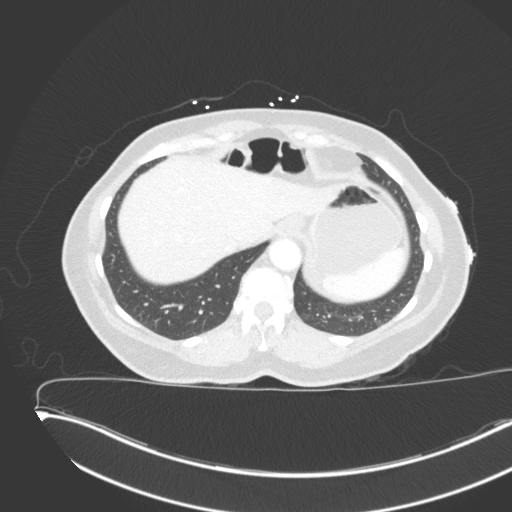
[im 64/180  mediastinal]
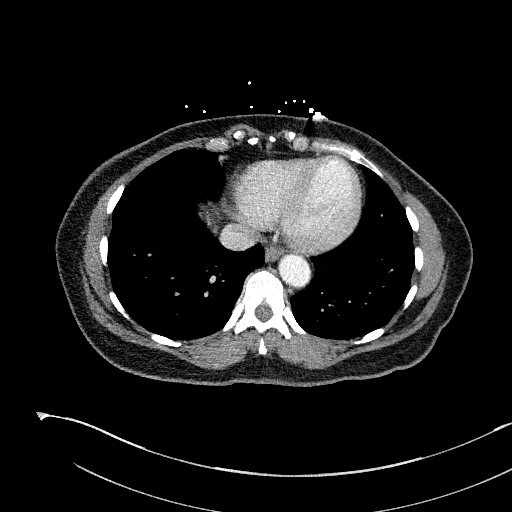
[im 64/180  lung]
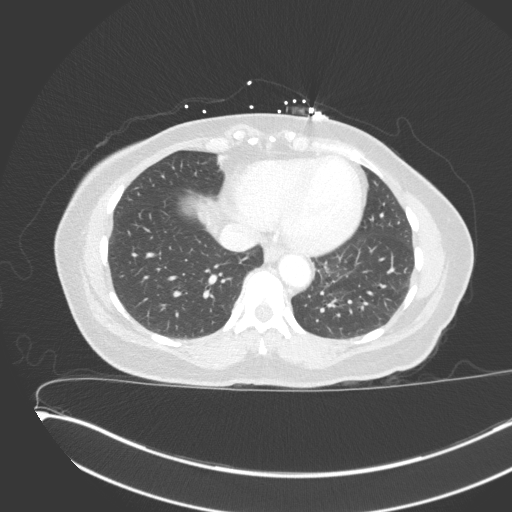
[im 77/180  lung]
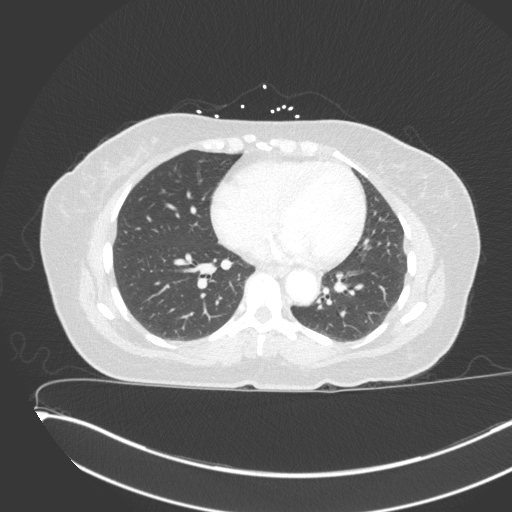
[im 103/180  lung]
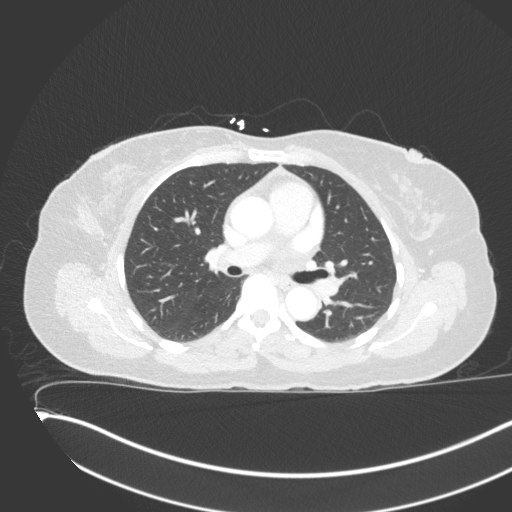
[im 116/180  lung]
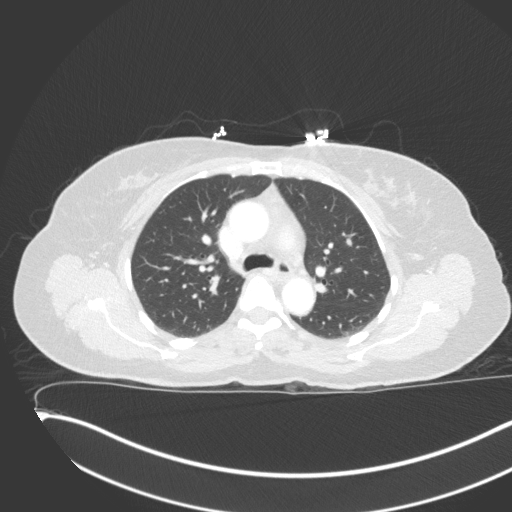
[im 128/180  mediastinal]
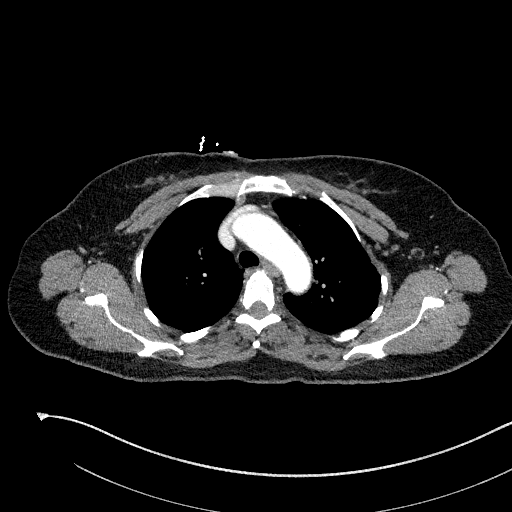
[im 128/180  lung]
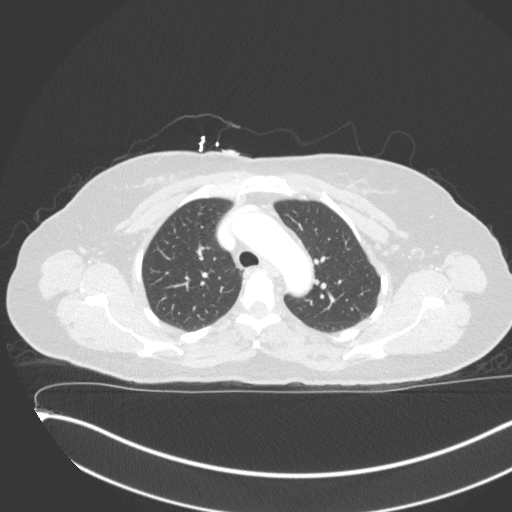
[im 141/180  lung]
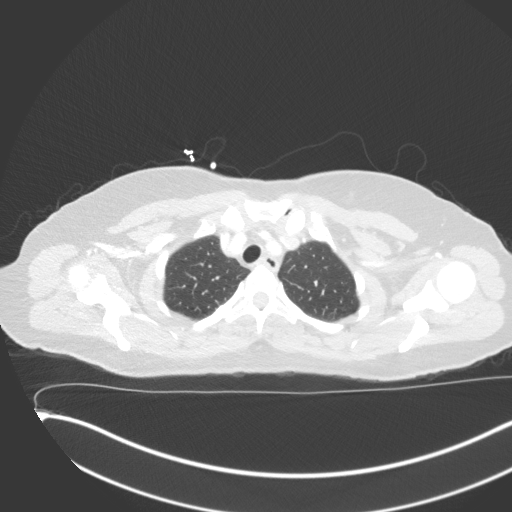
[im 154/180  lung]
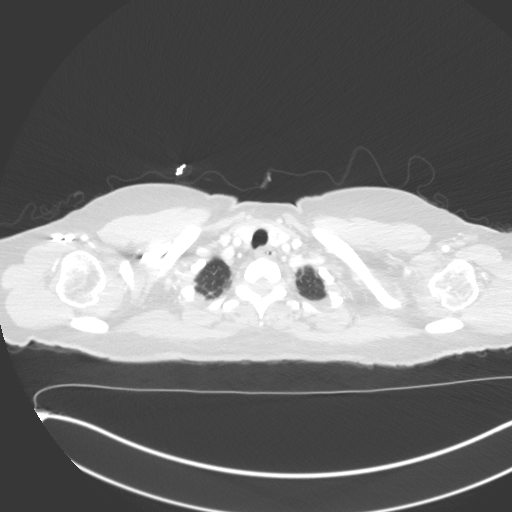
[im 167/180  lung]
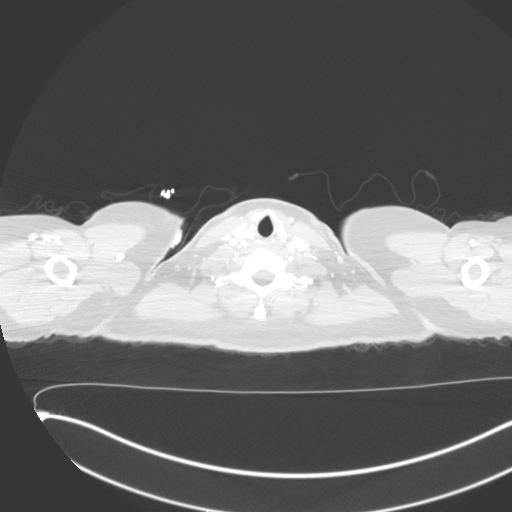

[Series 5: coronal · coronal · 0.77mm/px · 3 of 119 slices shown]
[im 24/119  lung]
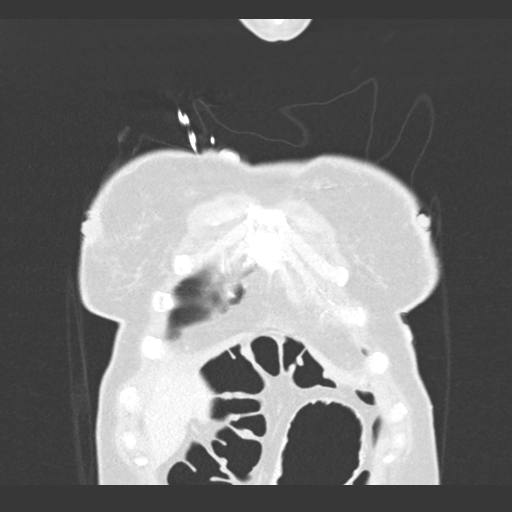
[im 48/119  lung]
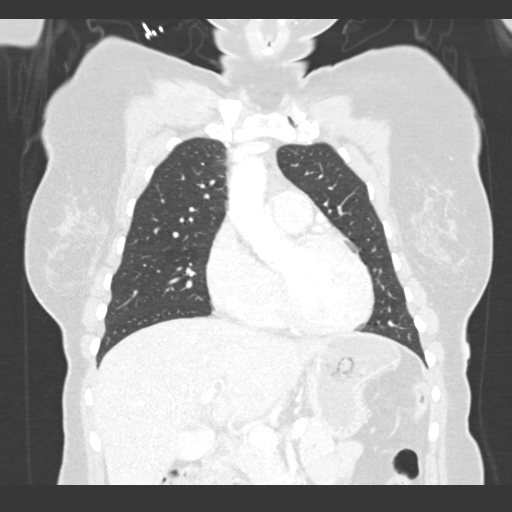
[im 71/119  lung]
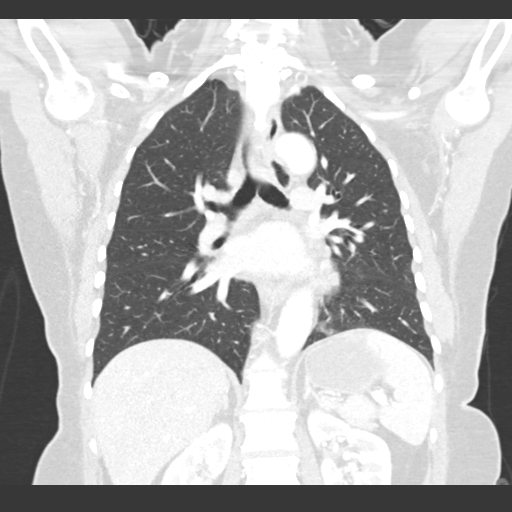

[15 of 36 positions shown; findings below may reference images not displayed]

FINDINGS: Cardiovascular: Normal aortic caliber. Tortuous thoracic aorta.
Borderline cardiomegaly.

Mediastinum/Nodes: 7 mm left thyroid nodule Not clinically
significant; no follow-up imaging recommended (ref: [HOSPITAL]. [DATE]): 143-50).

No mediastinal or hilar adenopathy.  No mass in the AP window.

Esophageal fluid level including on 65/3.

Lungs/Pleura: No pleural fluid. Mild left base scarring. No lung
mass.

Upper Abdomen: Gallbladder stones versus vicarious excretion of
contrast is evidenced by dependent hyperattenuation. Suspect at
least 1 too small to characterize right hepatic lobe lesion
inferiorly. Normal imaged portions of the spleen, stomach, pancreas,
adrenal glands, kidneys.

Musculoskeletal: No acute osseous abnormality.
IMPRESSION: 1. No thoracic mass to explain laryngeal paralysis.
2.  No acute process in the chest.
3. Esophageal air fluid level suggests dysmotility or
gastroesophageal reflux.
4. Cholelithiasis versus vicarious excretion of contrast.

## 2021-03-10 MED ORDER — CLOPIDOGREL BISULFATE 75 MG PO TABS
75.0000 mg | ORAL_TABLET | Freq: Every day | ORAL | Status: DC
  Administered 2021-03-10: 75 mg via ORAL
  Filled 2021-03-10: qty 1

## 2021-03-10 MED ORDER — MENTHOL 3 MG MT LOZG
1.0000 | LOZENGE | OROMUCOSAL | Status: DC | PRN
  Filled 2021-03-10: qty 9

## 2021-03-10 MED ORDER — ASPIRIN 325 MG PO TABS: 325.0000 mg | ORAL_TABLET | Freq: Every day | ORAL | Status: DC

## 2021-03-10 MED ORDER — IOHEXOL 300 MG/ML  SOLN
75.0000 mL | Freq: Once | INTRAMUSCULAR | Status: AC | PRN
  Administered 2021-03-10: 75 mL via INTRAVENOUS

## 2021-03-10 MED ORDER — ACETAMINOPHEN 650 MG RE SUPP: 650.0000 mg | RECTAL | Status: DC | PRN

## 2021-03-10 MED ORDER — LISINOPRIL 20 MG PO TABS
20.0000 mg | ORAL_TABLET | Freq: Every day | ORAL | Status: DC
  Administered 2021-03-10 – 2021-03-12 (×3): 20 mg via ORAL
  Filled 2021-03-10 (×3): qty 1

## 2021-03-10 MED ORDER — ASPIRIN EC 81 MG PO TBEC
81.0000 mg | DELAYED_RELEASE_TABLET | Freq: Every day | ORAL | Status: DC
  Administered 2021-03-10 – 2021-03-12 (×3): 81 mg via ORAL
  Filled 2021-03-10 (×3): qty 1

## 2021-03-10 MED ORDER — ENOXAPARIN SODIUM 40 MG/0.4ML IJ SOSY
40.0000 mg | PREFILLED_SYRINGE | INTRAMUSCULAR | Status: DC
  Administered 2021-03-10: 40 mg via SUBCUTANEOUS
  Filled 2021-03-10: qty 0.4

## 2021-03-10 MED ORDER — STROKE: EARLY STAGES OF RECOVERY BOOK: Freq: Once | Status: DC

## 2021-03-10 MED ORDER — SODIUM CHLORIDE 0.9 % IV SOLN: INTRAVENOUS | Status: DC

## 2021-03-10 MED ORDER — ASPIRIN 300 MG RE SUPP: 300.0000 mg | Freq: Every day | RECTAL | Status: DC

## 2021-03-10 MED ORDER — AMLODIPINE BESYLATE 10 MG PO TABS
10.0000 mg | ORAL_TABLET | Freq: Every day | ORAL | Status: DC
  Administered 2021-03-10 – 2021-03-12 (×3): 10 mg via ORAL
  Filled 2021-03-10 (×3): qty 1

## 2021-03-10 MED ORDER — DICLOFENAC SODIUM 1 % EX GEL
2.0000 g | Freq: Four times a day (QID) | CUTANEOUS | Status: DC
  Administered 2021-03-10 – 2021-03-12 (×7): 2 g via TOPICAL
  Filled 2021-03-10: qty 100

## 2021-03-10 MED ORDER — IBUPROFEN 400 MG PO TABS
400.0000 mg | ORAL_TABLET | ORAL | Status: DC | PRN
  Administered 2021-03-10: 400 mg via ORAL
  Filled 2021-03-10: qty 1

## 2021-03-10 MED ORDER — ACETAMINOPHEN 325 MG PO TABS
650.0000 mg | ORAL_TABLET | ORAL | Status: DC | PRN
  Administered 2021-03-11 – 2021-03-12 (×2): 650 mg via ORAL
  Filled 2021-03-10 (×2): qty 2

## 2021-03-10 MED ORDER — ACETAMINOPHEN 10 MG/ML IV SOLN
1000.0000 mg | Freq: Once | INTRAVENOUS | Status: AC
Start: 2021-03-10 — End: 2021-03-10
  Administered 2021-03-10: 1000 mg via INTRAVENOUS
  Filled 2021-03-10: qty 100

## 2021-03-10 MED ORDER — ALPRAZOLAM 0.25 MG PO TABS
0.2500 mg | ORAL_TABLET | Freq: Once | ORAL | Status: AC
  Administered 2021-03-10: 0.25 mg via ORAL
  Filled 2021-03-10: qty 1

## 2021-03-10 MED ORDER — ACETAMINOPHEN 160 MG/5ML PO SOLN: 650.0000 mg | ORAL | Status: DC | PRN

## 2021-03-10 MED ORDER — PHENOL 1.4 % MT LIQD
1.0000 | OROMUCOSAL | Status: DC | PRN
  Filled 2021-03-10: qty 177

## 2021-03-10 NOTE — H&P (Signed)
History and Physical    Alexis Kerr ZOX:[id number] DOB: 10-05-1960 DOA: 03/09/2021  PCP: System, Provider Not In  Patient coming from: Home  I have personally briefly reviewed patient's old medical records in [facility]  Chief Complaint: L sided weakness, hoarse voice  HPI: Alexis Kerr is a 60 y.o. female with medical history significant of untreated HTN.  Pt woke up last evening at 0200 with a sore throat.  This AM had hoarse voice and inability to lift left arm above her head.  Choked on her coffee.  This has worsened over past several hours.  Seen initially at [facility].  Transferred here for further work up with MRI brain and C spine.  No fever, no CP.   ED Course: MRI brain and C spine were neg for acut estroke but did demonstrate white matter disease most suggestive of chronic microvascular dz, difficult to exclude demyelinating dz.  Also CT neck soft tissue showed mildly enlarged tonsils, medialized left vocal cord concerning for paralysis.  No evidence of mass.   Review of Systems: As per HPI, otherwise all review of systems negative.  Past Medical History:  Diagnosis Date   Kidney stone on left side     Past Surgical History:  Procedure Laterality Date   CESAREAN SECTION       reports that she has never smoked. She has never used smokeless tobacco. She reports previous alcohol use. She reports that she does not use drugs.  Allergies  Allergen Reactions   Cephalexin    Sulfa Antibiotics    Tape     PAPER TAPE ONLY   Bacitracin Rash   Latex Rash    Family History  Problem Relation Age of Onset   Stroke Paternal Grandfather      Prior to Admission medications   Medication Sig Start Date End Date Taking? Authorizing Provider  diphenhydrAMINE (BENADRYL) 25 MG tablet Take 50 mg by mouth every 6 (six) hours as needed for allergies.   Yes [provider]  ibuprofen (ADVIL) 200 MG tablet Take 400 mg by mouth every 6 (six) hours as needed for  headache.   Yes [provider]  levonorgestrel (MIRENA) 20 MCG/DAY IUD by Intrauterine route. 06/06/14  Yes [provider]    Physical Exam: Vitals:   03/09/21 1444 03/09/21 1640 03/09/21 1700 03/09/21 2015  BP: (!) 183/103 (!) 159/99 (!) 190/102 (!) 197/104  Pulse: 80 82 76 78  Resp: Temp:      TempSrc:      SpO2: 99% 98% 97% 96%  Weight:      Height:        Constitutional: NAD, calm, comfortable Eyes: PERRL, lids and conjunctivae normal ENMT: Mucous membranes are moist. Posterior pharynx clear of any exudate or lesions.Normal dentition.  Neck: normal, supple, no masses, no thyromegaly Respiratory: clear to auscultation bilaterally, no wheezing, no crackles. Normal respiratory effort. No accessory muscle use.  Cardiovascular: Regular rate and rhythm, no murmurs / rubs / gallops. No extremity edema. 2+ pedal pulses. No carotid bruits.  Abdomen: no tenderness, no masses palpated. No hepatosplenomegaly. Bowel sounds positive.  Musculoskeletal: no clubbing / cyanosis. No joint deformity upper and lower extremities. Good ROM, no contractures. Normal muscle tone.  Skin: no rashes, lesions, ulcers. No induration Neurologic: Shoulder Shrug 4+/5 on Left, hoarse voice Psychiatric: Normal judgment and insight. Alert and oriented x 3. Normal mood.    Labs on Admission: I have personally reviewed following labs and imaging  studies  CBC: Recent Labs  Lab 03/09/21 1431  WBC 7.3  NEUTROABS 4.5  HGB 14.0  HCT 41.0  MCV 87.4  PLT 246   Basic Metabolic Panel: Recent Labs  Lab 03/09/21 1431  NA 141  K 3.8  CL 105  CO2 27  GLUCOSE 94  BUN 15  CREATININE 0.72  CALCIUM 9.6   GFR: Estimated Creatinine Clearance: 74.1 mL/min (by C-G formula based on SCr of 0.72 mg/dL). Liver Function Tests: Recent Labs  Lab 03/09/21 1431  AST 17  ALT 17  ALKPHOS 72  BILITOT 0.7  PROT 7.4  ALBUMIN 4.6   No results for input(s): LIPASE, AMYLASE in the last  168 hours. No results for input(s): AMMONIA in the last 168 hours. Coagulation Profile: No results for input(s): INR, PROTIME in the last 168 hours. Cardiac Enzymes: No results for input(s): CKTOTAL, CKMB, CKMBINDEX, TROPONINI in the last 168 hours. BNP (last 3 results) No results for input(s): PROBNP in the last 8760 hours. HbA1C: No results for input(s): HGBA1C in the last 72 hours. CBG: No results for input(s): GLUCAP in the last 168 hours. Lipid Profile: No results for input(s): CHOL, HDL, LDLCALC, TRIG, CHOLHDL, LDLDIRECT in the last 72 hours. Thyroid Function Tests: No results for input(s): TSH, T4TOTAL, FREET4, T3FREE, THYROIDAB in the last 72 hours. Anemia Panel: No results for input(s): VITAMINB12, FOLATE, FERRITIN, TIBC, IRON, RETICCTPCT in the last 72 hours. Urine analysis: No results found for: COLORURINE, APPEARANCEUR, LABSPEC, PHURINE, GLUCOSEU, HGBUR, BILIRUBINUR, KETONESUR, PROTEINUR, UROBILINOGEN, NITRITE, LEUKOCYTESUR  Radiological Exams on Admission: CT Head Wo Contrast  Result Date: 03/09/2021 CLINICAL DATA:  Neuro deficit, acute, stroke suspected EXAM: CT HEAD WITHOUT CONTRAST TECHNIQUE: Contiguous axial images were obtained from the base of the skull through the vertex without intravenous contrast. COMPARISON:  None. FINDINGS: Brain: No acute intracranial abnormality. Specifically, no hemorrhage, hydrocephalus, mass lesion, acute infarction, or significant intracranial injury. Vascular: No hyperdense vessel or unexpected calcification. Skull: No acute calvarial abnormality. Sinuses/Orbits: No acute findings Other: None IMPRESSION: No acute intracranial abnormality. Electronically Signed   By: Charlett Nose M.D.   On: 03/09/2021 16:44   CT Soft Tissue Neck W Contrast  Result Date: 03/09/2021 CLINICAL DATA:  Neck abscess, deep tissue. EXAM: CT NECK WITH CONTRAST TECHNIQUE: Multidetector CT imaging of the neck was performed using the standard protocol following the bolus  administration of intravenous contrast. CONTRAST:  75mL OMNIPAQUE IOHEXOL 300 MG/ML  SOLN COMPARISON:  None. FINDINGS: Pharynx and larynx: Mildly enlarged and heterogeneous palatine tonsils, suggestive of tonsillitis. Salivary glands: No inflammation, mass, or stone. Thyroid: Approximately 8 mm left thyroid nodule, which is not require further imaging follow-up per current guidelines. Lymph nodes: None enlarged or abnormal density. Vascular: Limited evaluation due to non arterial timing with major arteries in the neck appearing grossly patent. Limited intracranial: Negative. Visualized orbits: Negative. Mastoids and visualized paranasal sinuses: Clear. 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 Upper chest: Visualized lung apices are clear. IMPRESSION: 1. Mildly enlarged and heterogeneous palatine tonsils, which may reflect tonsillitis. Recommend correlation with direct inspection. No discrete drainable fluid collection. 2. 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 No evidence of a mass along the expected course of the recurrent laryngeal in the neck; however, the AP window is incompletely imaged on this study. Recommend non urgent follow-up chest CT with contrast to fully characterize. 3. 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 Electronically  Signed   By: Feliberto Harts MD   On: 03/09/2021 17:17   MR Brain W and Wo Contrast  Result Date: 03/09/2021 CLINICAL DATA:  Initial evaluation for possible stroke versus demyelinating disease. EXAM: MRI HEAD WITHOUT AND WITH CONTRAST MRI CERVICAL SPINE WITHOUT AND WITH CONTRAST TECHNIQUE: Multiplanar, multiecho pulse sequences of the brain and surrounding structures, and cervical spine, to include the craniocervical junction and cervicothoracic junction, were obtained without and with intravenous contrast. CONTRAST:   50mL GADAVIST GADOBUTROL 1 MMOL/ML IV SOLN COMPARISON:  Prior CT from earlier the same day. FINDINGS: MRI HEAD FINDINGS Brain: Cerebral volume within normal limits for age. Patchy and hazy T2/FLAIR hyperintensity seen involving the periventricular and deep white matter of both cerebral hemispheres, nonspecific, but overall mild to moderate in nature. Overall, changes are most pronounced within the right frontal lobe (series 11, image 18). Few corresponding areas of T1 hypointensity noted about the basal ganglia (series 16, images 28, 31), favored to reflect small remote lacunar infarcts. No infratentorial signal abnormality. No abnormal enhancement to suggest acute or subacute ischemia or active demyelination. Gray-white matter differentiation maintained. No encephalomalacia to suggest chronic cortical infarction. No foci of susceptibility artifact to suggest acute or chronic intracranial hemorrhage. No definite mass lesion. No mass effect or midline shift. Ventricles normal in size without hydrocephalus. Cavum et septum pellucidum noted. No extra-axial fluid collection. Pituitary gland and suprasellar region within normal limits. Midline structures intact and normal. Prominent calcification noted along the anterior falx. There is an apparent punctate 3 mm focus of enhancement involving the inferior left basal ganglia (series 18, image 21). Finding of uncertain etiology or significance. No other abnormal enhancement. Vascular: Major intracranial vascular flow voids are maintained. Skull and upper cervical spine: Craniocervical junction within normal limits. Bone marrow signal intensity normal. No scalp soft tissue abnormality. Sinuses/Orbits: Globes and orbital soft tissues within normal limits. Paranasal sinuses are largely clear. No mastoid effusion. Inner ear structures grossly normal. Other: None. MRI CERVICAL SPINE FINDINGS Alignment: Straightening with mild reversal of the normal cervical lordosis. Trace  anterolisthesis of C4 on C5, with trace retrolisthesis of C5 on C6. 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 Vertebral body height maintained without acute or chronic fracture. Bone marrow signal intensity within normal limits. No discrete or worrisome osseous lesions. No abnormal marrow edema or enhancement. Cord: Signal intensity within the cervical spinal cord is normal. Normal cord signal changes to suggest demyelinating disease. Normal cord caliber morphology. No abnormal enhancement. Posterior Fossa, vertebral arteries, paraspinal tissues: Craniocervical junction within normal limits. Paraspinous and prevertebral soft tissues within normal limits. Normal flow voids seen within the vertebral arteries bilaterally. Disc levels: C2-C3: Unremarkable. C3-C4: Mild right greater than left uncovertebral hypertrophy without significant disc bulge. Mild right-sided facet degeneration. No spinal stenosis. Mild right C4 foraminal narrowing. Left neural foramina remains patent. C4-C5: Mild disc bulge with uncovertebral and endplate spurring. Flattening of the ventral thecal sac without significant spinal stenosis or cord deformity. Mild right C5 foraminal stenosis. Left neural foramina remains patent. C5-C6: Degenerative intervertebral disc space narrowing with diffuse disc osteophyte complex. Broad posterior component flattens and partially faces the ventral thecal sac with mild spinal stenosis. No more than minimal flattening of the ventral cord. Moderate left with mild right C6 foraminal narrowing. C6-C7: Congenital fusion of C6 and C7. No canal or foraminal stenosis. C7-T1: Mild disc bulge with right greater than left uncovertebral hypertrophy. Bilateral facet degeneration. No spinal  stenosis. Mild to moderate right C8 foraminal narrowing. Left neural foramina remains patent. Visualized upper thoracic spine demonstrates no significant finding. IMPRESSION:  MRI HEAD IMPRESSION: 1. Patchy and hazy T2/FLAIR hyperintensity involving the supratentorial cerebral white matter as above, nonspecific, but overall mild to moderate in nature. 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 Possible demyelinating disease is not entirely excluded, although appearance would be atypical for this process. No convincing changes of active demyelination. 2. Punctate 3 mm focus of enhancement involving the inferior left basal ganglia. Finding is of uncertain etiology or significance, and could reflect a small vascular structure. Outpatient neurology follow-up with short interval follow-up MRI in 2-3 months to document stability recommended. 3. Otherwise negative brain MRI for age. No other acute intracranial abnormality. MRI CERVICAL SPINE IMPRESSION: 1. Normal MRI appearance of the cervical spinal cord. No evidence for demyelinating disease. 2. 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 Associated mild-to-moderate right C4, C5, C6, and C8 foraminal stenosis, with moderate left C6 foraminal narrowing. Electronically Signed   By: Rise MuBenjamin  McClintock M.D.   On: 03/09/2021 23:48   MR Cervical Spine W or Wo Contrast  Result Date: 03/09/2021 CLINICAL DATA:  Initial evaluation for possible stroke versus demyelinating disease. EXAM: MRI HEAD WITHOUT AND WITH CONTRAST MRI CERVICAL SPINE WITHOUT AND WITH CONTRAST TECHNIQUE: Multiplanar, multiecho pulse sequences of the brain and surrounding structures, and cervical spine, to include the craniocervical junction and cervicothoracic junction, were obtained without and with intravenous contrast. CONTRAST:  7mL GADAVIST GADOBUTROL 1 MMOL/ML IV SOLN COMPARISON:  Prior CT from earlier the same day. FINDINGS: MRI HEAD FINDINGS Brain: Cerebral volume within normal limits for age. Patchy and hazy T2/FLAIR hyperintensity seen involving the periventricular and deep  white matter of both cerebral hemispheres, nonspecific, but overall mild to moderate in nature. Overall, changes are most pronounced within the right frontal lobe (series 11, image 18). Few corresponding areas of T1 hypointensity noted about the basal ganglia (series 16, images 28, 31), favored to reflect small remote lacunar infarcts. No infratentorial signal abnormality. No abnormal enhancement to suggest acute or subacute ischemia or active demyelination. Gray-white matter differentiation maintained. No encephalomalacia to suggest chronic cortical infarction. No foci of susceptibility artifact to suggest acute or chronic intracranial hemorrhage. No definite mass lesion. No mass effect or midline shift. Ventricles normal in size without hydrocephalus. Cavum et septum pellucidum noted. No extra-axial fluid collection. Pituitary gland and suprasellar region within normal limits. Midline structures intact and normal. Prominent calcification noted along the anterior falx. There is an apparent punctate 3 mm focus of enhancement involving the inferior left basal ganglia (series 18, image 21). Finding of uncertain etiology or significance. No other abnormal enhancement. Vascular: Major intracranial vascular flow voids are maintained. Skull and upper cervical spine: Craniocervical junction within normal limits. Bone marrow signal intensity normal. No scalp soft tissue abnormality. Sinuses/Orbits: Globes and orbital soft tissues within normal limits. Paranasal sinuses are largely clear. No mastoid effusion. Inner ear structures grossly normal. Other: None. MRI CERVICAL SPINE FINDINGS Alignment: Straightening with mild reversal of the normal cervical lordosis. Trace anterolisthesis of C4 on C5, with trace retrolisthesis of C5 on C6. 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 Vertebral body height maintained without acute or chronic fracture. Bone marrow signal  intensity within normal limits. No discrete or worrisome osseous lesions. No abnormal marrow edema or enhancement. Cord: Signal intensity within the cervical  spinal cord is normal. Normal cord signal changes to suggest demyelinating disease. Normal cord caliber morphology. No abnormal enhancement. Posterior Fossa, vertebral arteries, paraspinal tissues: Craniocervical junction within normal limits. Paraspinous and prevertebral soft tissues within normal limits. Normal flow voids seen within the vertebral arteries bilaterally. Disc levels: C2-C3: Unremarkable. C3-C4: Mild right greater than left uncovertebral hypertrophy without significant disc bulge. Mild right-sided facet degeneration. No spinal stenosis. Mild right C4 foraminal narrowing. Left neural foramina remains patent. C4-C5: Mild disc bulge with uncovertebral and endplate spurring. Flattening of the ventral thecal sac without significant spinal stenosis or cord deformity. Mild right C5 foraminal stenosis. Left neural foramina remains patent. C5-C6: Degenerative intervertebral disc space narrowing with diffuse disc osteophyte complex. Broad posterior component flattens and partially faces the ventral thecal sac with mild spinal stenosis. No more than minimal flattening of the ventral cord. Moderate left with mild right C6 foraminal narrowing. C6-C7: Congenital fusion of C6 and C7. No canal or foraminal stenosis. C7-T1: Mild disc bulge with right greater than left uncovertebral hypertrophy. Bilateral facet degeneration. No spinal stenosis. Mild to moderate right C8 foraminal narrowing. Left neural foramina remains patent. Visualized upper thoracic spine demonstrates no significant finding. IMPRESSION: MRI HEAD IMPRESSION: 1. Patchy and hazy T2/FLAIR hyperintensity involving the supratentorial cerebral white matter as above, nonspecific, but overall mild to moderate in nature. While these findings are nonspecific, changes of chronic microvascular ischemic  disease are favored. Possible demyelinating disease is not entirely excluded, although appearance would be atypical for this process. No convincing changes of active demyelination. 2. Punctate 3 mm focus of enhancement involving the inferior left basal ganglia. Finding is of uncertain etiology or significance, and could reflect a small vascular structure. Outpatient neurology follow-up with short interval follow-up MRI in 2-3 months to document stability recommended. 3. Otherwise negative brain MRI for age. No other acute intracranial abnormality. MRI CERVICAL SPINE IMPRESSION: 1. Normal MRI appearance of the cervical spinal cord. No evidence for demyelinating disease. 2. 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 Associated mild-to-moderate right C4, C5, C6, and C8 foraminal stenosis, with moderate left C6 foraminal narrowing. Electronically Signed   By: Rise Mu M.D.   On: 03/09/2021 23:48   DG Chest Portable 1 View  Result Date: 03/09/2021 CLINICAL DATA:  Shortness of breath.  Pharyngitis. EXAM: PORTABLE CHEST 1 VIEW COMPARISON:  None. FINDINGS: The heart size and mediastinal contours are within normal limits. Both lungs are clear. The visualized skeletal structures are unremarkable. IMPRESSION: No active disease. Electronically Signed   By: Beckie Salts M.D.   On: 03/09/2021 14:43    EKG: Independently reviewed.  Assessment/Plan Principal Problem:   Acute ischemic stroke [facility]) Active Problems:   Elevated blood pressure reading    Acute ischemic stroke - Despite neg MRI, neurology feels she likely has a tiny acute stroke in medulla of the nucleus ambiguus and spinal accessory nucleus. See neuro consult note Stroke pathway 2d echo MRA head, US carotids Tele monitor PT/OT/SLP ASA 81 permanently + plavix 75 x 21 days Likely undiagnosed HTN - Allowing permissive HTN at this time due to acute ischemic stroke  DVT prophylaxis: Lovenox Code  Status: Full Family Communication: Family at bedside Disposition Plan: Home after stroke work up [employer] called: Neuro Admission status: Admit to inpatient  Severity of Illness: The appropriate patient status for this patient is INPATIENT. Inpatient status is judged to be reasonable and necessary in order to provide the required intensity of service to ensure the patient's safety.  The patient's presenting symptoms, physical exam findings, and initial radiographic and laboratory data in the context of their chronic comorbidities is felt to place them at high risk for further clinical deterioration. Furthermore, it is not anticipated that the patient will be medically stable for discharge from the hospital within 2 midnights of admission. The following factors support the patient status of inpatient.   IP status due to acute ischemic stroke.   * I certify that at the point of admission it is my clinical judgment that the patient will require inpatient hospital care spanning beyond 2 midnights from the point of admission due to high intensity of service, high risk for further deterioration and high frequency of surveillance required.*   Shamiah Kahler M. DO [facility]  How to contact the [facility] Attending or Consulting provider 7A - 7P or covering provider during after hours 7P -7A, for this patient?  Check the care team in [facility] and look for a) attending/consulting [facility] provider listed and b) the [facility] team listed Log into [email or web address]  Am[employer] Physician Scheduling and messaging for groups and whole hospitals  On call and physician scheduling software for group practices, residents, hospitalists and other medical providers for call, clinic, rotation and shift schedules. On[employer] is a hospital-wide system for scheduling doctors and paging doctors on call. EasyPlot is for scientific plotting and data analysis.  [email or web address]  and use [facility]'s universal password to access. If you do  not have the password, please contact the hospital operator.  Locate the [facility] provider you are looking for under [facility] and page to a number that you can be directly reached. If you still have difficulty reaching the provider, please page the [facility] (Director on Call) for the Hospitalists listed on amion for assistance.  03/10/2021, 2:57 AM

## 2021-03-10 NOTE — Progress Notes (Signed)
  Speech Language Pathology Treatment: Cognitive-Linquistic (Dysphonia)  Patient Details Name: Alexis Kerr MRN: [id number] DOB: [birth year] Today's Date: 03/10/2021 Time: 9326-7124 SLP Time Calculation (min) (ACUTE ONLY): 19 min  Assessment / Plan / Recommendation Clinical Impression  Pt was seen for dysphonia treatment with her husband present. Both parties were educated regarding the swallowing mechanism, the laryngeal system, and the likely impact on these systems given her current presentation. 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 Pt expressed her distress regarding her vocal quality and was intermittently tearful throughout the session when various facilitative techniques were attempted to improve phonation. Pt was able to progressively increase vocal intensity while counting to 10. Improvement in voicing was noted during this activity, but vocal roughness progressively increased as well. Pt's tendency was to whisper and she was advised regarding the potential for this causing her more vocal fatigue. 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 However, she was amenable to increasing voicing and was able to maintain this throughout the session. Pt reported clavicular fatigue/chest and stated that she believes she is now using more accessory muscles during speech. She was educated regarding using tactile feedback intermittently during speech to ensure use of diaphragmatic breathing. She verbalized understanding and agreement. SLP will continue to follow pt.    HPI HPI: Pt is a 60 y.o. female who presented with L sided weakness, hoarse voice, and difficulty swallowing liquids.  MRI Brain and C spine which were negative for an acute stroke but  did demonstrate white matter disease most suggestive of chronic microvascular disease, difficult to exclude demyelinating disease. MRI brain: Punctate 3 mm focus of enhancement involving the inferior left  basal ganglia. CT neck soft tissue which demonstrated mildly enlarged and geterogenous palatine tonsils which may reflect tonsillitis. 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 Pt failed [facility] swallow screen since she stopped drinking. PMH: untreated HTN.      SLP Plan  Continue with current plan of care  Patient needs continued Speech Lanaguage Pathology Services    Recommendations  Diet recommendations: Thin liquid (clear liquids) Medication Administration: Crushed with puree Compensations: Slow rate;Small sips/bites                Oral Care Recommendations: Oral care BID Follow up Recommendations: Outpatient SLP SLP Visit Diagnosis: Aphonia (R49.1);Other (comment) (Dysphonia) Plan: Continue with current plan of care       Mardy Lucier I. Vear Clock, MS, CCC-SLP Acute Rehabilitation Services Office number [phone] Pager [phone]                Scheryl Marten 03/10/2021, 1:44 PM

## 2021-03-10 NOTE — Plan of Care (Signed)
Care plan initiated.

## 2021-03-10 NOTE — Consult Note (Signed)
Reason for Consult: Loss of voice, multiple other symptoms Referring Physician: Calvert Cantor, MD  Alexis Kerr is an 60 y.o. female.  HPI: Healthy lady, visiting here from [place].  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  On Wednesday she woke up with a small swollen area in her left external ear.  She assumed it was an insect bite.  Friday and Saturday she started developing a sensation of swelling in her throat, and eventually lost her voice.  She is having some pain in her left ear and down her left neck.  She feels that her left neck is swollen.  She has never had symptoms like this before.  She is having trouble swallowing solids but is able to drink liquids without choking or coughing.  She has also noticed difficulty raising her left arm beyond horizontal.  This was felt to be due to some local musculoskeletal problem according to the medical service.  Past Medical History:  Diagnosis Date   Hypertension    Kidney stone on left side     Past Surgical History:  Procedure Laterality Date   CESAREAN SECTION      Family History  Problem Relation Age of Onset   Stroke Paternal Grandfather     Social History:  reports that she has never smoked. She has never used smokeless tobacco. She reports previous alcohol use. She reports that she does not use drugs.  Allergies:  Allergies  Allergen Reactions   Cephalexin    Sulfa Antibiotics    Tape     PAPER TAPE ONLY   Bacitracin Rash   Latex Rash    Medications: Reviewed  Results for orders placed or performed during the hospital encounter of 03/09/21 (from the past 48 hour(s))  CBC with Differential     Status: None   Collection Time: 03/09/21  2:31 PM  Result Value Ref Range   WBC 7.3 4.0 - 10.5 K/uL   RBC 4.69 3.87 - 5.11 MIL/uL   Hemoglobin 14.0 12.0 - 15.0 g/dL   HCT 81.2 75.1 - 70.0 %   MCV 87.4 80.0 - 100.0 fL   MCH 29.9 26.0 - 34.0 pg   MCHC 34.1 30.0 - 36.0 g/dL   RDW 17.4 94.4 - 96.7 %    Platelets 246 150 - 400 K/uL   nRBC 0.0 0.0 - 0.2 %   Neutrophils Relative % 61 %   Neutro Abs 4.5 1.7 - 7.7 K/uL   Lymphocytes Relative 26 %   Lymphs Abs 1.9 0.7 - 4.0 K/uL   Monocytes Relative 10 %   Monocytes Absolute 0.7 0.1 - 1.0 K/uL   Eosinophils Relative 2 %   Eosinophils Absolute 0.1 0.0 - 0.5 K/uL   Basophils Relative 1 %   Basophils Absolute 0.0 0.0 - 0.1 K/uL   Immature Granulocytes 0 %   Abs Immature Granulocytes 0.02 0.00 - 0.07 K/uL    Comment: Performed at [facility], [address] [place], [place] [address]  Comprehensive metabolic panel     Status: None   Collection Time: 03/09/21  2:31 PM  Result Value Ref Range   Sodium 141 135 - 145 mmol/L   Potassium 3.8 3.5 - 5.1 mmol/L   Chloride 105 98 - 111 mmol/L   CO2 27 22 - 32 mmol/L   Glucose, Bld 94 70 - 99 mg/dL    Comment: Glucose reference range applies only to samples taken after fasting for at least 8 hours.  BUN 15 6 - 20 mg/dL   Creatinine, Ser 4.09 0.44 - 1.00 mg/dL   Calcium 9.6 8.9 - 81.1 mg/dL   Total Protein 7.4 6.5 - 8.1 g/dL   Albumin 4.6 3.5 - 5.0 g/dL   AST 17 15 - 41 U/L   ALT 17 0 - 44 U/L   Alkaline Phosphatase 72 38 - 126 U/L   Total Bilirubin 0.7 0.3 - 1.2 mg/dL   GFR, Estimated >91 >47 mL/min    Comment: (NOTE) Calculated using the CKD-EPI Creatinine Equation (2021)    Anion gap 9 5 - 15    Comment: Performed at [facility], [address], [place], [place] [address]  Resp Panel by RT-PCR (Flu A&B, Covid) Nasopharyngeal Swab     Status: None   Collection Time: 03/09/21  2:31 PM   Specimen: Nasopharyngeal Swab; Nasopharyngeal(NP) swabs in vial transport medium  Result Value Ref Range   SARS Coronavirus 2 by RT PCR NEGATIVE NEGATIVE    Comment: (NOTE) SARS-CoV-2 target nucleic acids are NOT DETECTED.  The SARS-CoV-2 RNA is generally detectable in upper respiratory specimens during the acute phase of infection. The lowest concentration of  SARS-CoV-2 viral copies this assay can detect is 138 copies/mL. A negative result does not preclude SARS-Cov-2 infection and should not be used as the sole basis for treatment or other patient management decisions. A negative result may occur with  improper specimen collection/handling, submission of specimen other than nasopharyngeal swab, presence of viral mutation(s) within the areas targeted by this assay, and inadequate number of viral copies(<138 copies/mL). A negative result must be combined with clinical observations, patient history, and epidemiological information. The expected result is Negative.  Fact Sheet for Patients:  BloggerCourse.com  Fact Sheet for Healthcare Providers:  SeriousBroker.it  This test is no t yet approved or cleared by the [place] FDA and  has been authorized for detection and/or diagnosis of SARS-CoV-2 by FDA under an Emergency Use Authorization (EUA). This EUA will remain  in effect (meaning this test can be used) for the duration of the COVID-19 declaration under Section 564(b)(1) of the Act, 21 U.S.C.section 360bbb-3(b)(1), unless the authorization is terminated  or revoked sooner.       Influenza A by PCR NEGATIVE NEGATIVE   Influenza B by PCR NEGATIVE NEGATIVE    Comment: (NOTE) The Xpert Xpress SARS-CoV-2/FLU/RSV plus assay is intended as an aid in the diagnosis of influenza from Nasopharyngeal swab specimens and should not be used as a sole basis for treatment. Nasal washings and aspirates are unacceptable for Xpert Xpress SARS-CoV-2/FLU/RSV testing.  Fact Sheet for Patients: BloggerCourse.com  Fact Sheet for Healthcare Providers: SeriousBroker.it  This test is not yet approved or cleared by the [place] FDA and has been authorized for detection and/or diagnosis of SARS-CoV-2 by FDA under an Emergency Use Authorization (EUA).  This EUA will remain in effect (meaning this test can be used) for the duration of the COVID-19 declaration under Section 564(b)(1) of the Act, 21 U.S.C. section 360bbb-3(b)(1), unless the authorization is terminated or revoked.  Performed at [facility], [address] [place], [place] [address]   Group A Strep by PCR     Status: None   Collection Time: 03/09/21  4:39 PM   Specimen: Throat; Sterile Swab  Result Value Ref Range   Group A Strep by PCR NOT DETECTED NOT DETECTED    Comment: Performed at [facility], [address] [place], [place] [address]  HIV  Antibody (routine testing w rflx)     Status: None   Collection Time: 03/10/21  2:22 AM  Result Value Ref Range   HIV Screen 4th Generation wRfx Non Reactive Non Reactive    Comment: Performed at [facility], [address] [place], [place] [address]  Hemoglobin A1c     Status: None   Collection Time: 03/10/21  4:02 AM  Result Value Ref Range   Hgb A1c MFr Bld 5.5 4.8 - 5.6 %    Comment: (NOTE) Pre diabetes:          5.7%-6.4%  Diabetes:              >6.4%  Glycemic control for   <7.0% adults with diabetes    Mean Plasma Glucose 111.15 mg/dL    Comment: Performed at [facility], [address], [place], [place] [address]  Lipid panel     Status: Abnormal   Collection Time: 03/10/21  4:02 AM  Result Value Ref Range   Cholesterol 209 (H) 0 - 200 mg/dL   Triglycerides 90 <562 mg/dL   HDL 68 >13 mg/dL   Total CHOL/HDL Ratio 3.1 RATIO   VLDL 18 0 - 40 mg/dL   LDL Cholesterol 086 (H) 0 - 99 mg/dL    Comment:        Total Cholesterol/HDL:CHD Risk Coronary Heart Disease Risk Table                     Men   Women  1/2 Average Risk   3.4   3.3  Average Risk       5.0   4.4  2 X Average Risk   9.6   7.1  3 X Average Risk  23.4   11.0        Use the calculated Patient Ratio above and the CHD Risk Table to determine the patient's CHD Risk.        ATP III  CLASSIFICATION (LDL):  <100     mg/dL   Optimal  578-469  mg/dL   Near or Above                    Optimal  130-159  mg/dL   Borderline  629-528  mg/dL   High  >413     mg/dL   Very High Performed at [facility], [address], [place], [place] [address]     CT Head Wo Contrast  Result Date: 03/09/2021 CLINICAL DATA:  Neuro deficit, acute, stroke suspected EXAM: CT HEAD WITHOUT CONTRAST TECHNIQUE: Contiguous axial images were obtained from the base of the skull through the vertex without intravenous contrast. COMPARISON:  None. FINDINGS: Brain: No acute intracranial abnormality. Specifically, no hemorrhage, hydrocephalus, mass lesion, acute infarction, or significant intracranial injury. Vascular: No hyperdense vessel or unexpected calcification. Skull: No acute calvarial abnormality. Sinuses/Orbits: No acute findings Other: None IMPRESSION: No acute intracranial abnormality. Electronically Signed   By: Charlett Nose M.D.   On: 03/09/2021 16:44   CT Soft Tissue Neck W Contrast  Result Date: 03/09/2021 CLINICAL DATA:  Neck abscess, deep tissue. EXAM: CT NECK WITH CONTRAST TECHNIQUE: Multidetector CT imaging of the neck was performed using the standard protocol following the bolus administration of intravenous contrast. CONTRAST:  75mL OMNIPAQUE IOHEXOL 300 MG/ML  SOLN COMPARISON:  None. FINDINGS: Pharynx and larynx: Mildly enlarged and heterogeneous palatine tonsils, suggestive of tonsillitis. Salivary glands: No inflammation, mass, or stone. Thyroid: Approximately  8 mm left thyroid nodule, which is not require further imaging follow-up per current guidelines. Lymph nodes: None enlarged or abnormal density. Vascular: Limited evaluation due to non arterial timing with major arteries in the neck appearing grossly patent. Limited intracranial: Negative. Visualized orbits: Negative. Mastoids and visualized paranasal sinuses: Clear. 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 Upper chest: Visualized lung apices are clear. IMPRESSION: 1. Mildly enlarged and heterogeneous palatine tonsils, which may reflect tonsillitis. Recommend correlation with direct inspection. No discrete drainable fluid collection. 2. 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 No evidence of a mass along the expected course of the recurrent laryngeal in the neck; however, the AP window is incompletely imaged on this study. Recommend non urgent follow-up chest CT with contrast to fully characterize. 3. 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 Electronically Signed   By: Feliberto Harts MD   On: 03/09/2021 17:17   CT CHEST W CONTRAST  Result Date: 03/10/2021 CLINICAL DATA:  Upper respiratory illness. Evaluate recurrent laryngeal nerve. Abnormal left vocal cord on neck CT. EXAM: CT CHEST WITH CONTRAST TECHNIQUE: Multidetector CT imaging of the chest was performed during intravenous contrast administration. CONTRAST:  75mL OMNIPAQUE IOHEXOL 300 MG/ML  SOLN COMPARISON:  Neck CT of 1 day prior. Chest radiograph of 1 day prior. FINDINGS: Cardiovascular: Normal aortic caliber. Tortuous thoracic aorta. Borderline cardiomegaly. Mediastinum/Nodes: 7 mm left thyroid nodule Not clinically significant; no follow-up imaging recommended (ref: J Am Coll Radiol. 2015 Feb;12(2): 143-50). No mediastinal or hilar adenopathy.  No mass in the AP window. Esophageal fluid level including on 65/3. Lungs/Pleura: No pleural fluid. Mild left base scarring. No lung mass. Upper Abdomen: Gallbladder stones versus vicarious excretion of contrast is evidenced by dependent hyperattenuation. Suspect at least 1 too small to characterize right hepatic lobe lesion inferiorly. Normal imaged portions of the spleen, stomach, pancreas, adrenal glands, kidneys. Musculoskeletal: No acute osseous abnormality. IMPRESSION:  1. No thoracic mass to explain laryngeal paralysis. 2.  No acute process in the chest. 3. Esophageal air fluid level suggests dysmotility or gastroesophageal reflux. 4. Cholelithiasis versus vicarious excretion of contrast. Electronically Signed   By: Jeronimo Greaves M.D.   On: 03/10/2021 18:49   MR ANGIO HEAD WO CONTRAST  Result Date: 03/10/2021 CLINICAL DATA:  Neuro deficit with stroke suspected EXAM: MRA HEAD WITHOUT CONTRAST TECHNIQUE: Angiographic images of the Circle of Willis were acquired using MRA technique without intravenous contrast. COMPARISON:  No pertinent prior exam. FINDINGS: Anterior circulation: Vessels are smooth and widely patent. Negative for aneurysm Posterior circulation: Vessels are smooth and widely patent. Negative for aneurysm Anatomic variants: None significant IMPRESSION: Normal MRA. Electronically Signed   By: Marnee Spring M.D.   On: 03/10/2021 07:33   MR Brain W and Wo Contrast  Result Date: 03/09/2021 CLINICAL DATA:  Initial evaluation for possible stroke versus demyelinating disease. EXAM: MRI HEAD WITHOUT AND WITH CONTRAST MRI CERVICAL SPINE WITHOUT AND WITH CONTRAST TECHNIQUE: Multiplanar, multiecho pulse sequences of the brain and surrounding structures, and cervical spine, to include the craniocervical junction and cervicothoracic junction, were obtained without and with intravenous contrast. CONTRAST:  7mL GADAVIST GADOBUTROL 1 MMOL/ML IV SOLN COMPARISON:  Prior CT from earlier the same day. FINDINGS: MRI HEAD FINDINGS Brain: Cerebral volume within normal limits for age. Patchy and hazy T2/FLAIR hyperintensity seen involving the periventricular and deep white matter of both cerebral hemispheres, nonspecific, but overall mild to moderate in  nature. Overall, changes are most pronounced within the right frontal lobe (series 11, image 18). Few corresponding areas of T1 hypointensity noted about the basal ganglia (series 16, images 28, 31), favored to reflect small remote  lacunar infarcts. No infratentorial signal abnormality. No abnormal enhancement to suggest acute or subacute ischemia or active demyelination. Gray-white matter differentiation maintained. No encephalomalacia to suggest chronic cortical infarction. No foci of susceptibility artifact to suggest acute or chronic intracranial hemorrhage. No definite mass lesion. No mass effect or midline shift. Ventricles normal in size without hydrocephalus. Cavum et septum pellucidum noted. No extra-axial fluid collection. Pituitary gland and suprasellar region within normal limits. Midline structures intact and normal. Prominent calcification noted along the anterior falx. There is an apparent punctate 3 mm focus of enhancement involving the inferior left basal ganglia (series 18, image 21). Finding of uncertain etiology or significance. No other abnormal enhancement. Vascular: Major intracranial vascular flow voids are maintained. Skull and upper cervical spine: Craniocervical junction within normal limits. Bone marrow signal intensity normal. No scalp soft tissue abnormality. Sinuses/Orbits: Globes and orbital soft tissues within normal limits. Paranasal sinuses are largely clear. No mastoid effusion. Inner ear structures grossly normal. Other: None. MRI CERVICAL SPINE FINDINGS Alignment: Straightening with mild reversal of the normal cervical lordosis. Trace anterolisthesis of C4 on C5, with trace retrolisthesis of C5 on C6. 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 Vertebral body height maintained without acute or chronic fracture. Bone marrow signal intensity within normal limits. No discrete or worrisome osseous lesions. No abnormal marrow edema or enhancement. Cord: Signal intensity within the cervical spinal cord is normal. Normal cord signal changes to suggest demyelinating disease. Normal cord caliber morphology. No abnormal enhancement. Posterior Fossa,  vertebral arteries, paraspinal tissues: Craniocervical junction within normal limits. Paraspinous and prevertebral soft tissues within normal limits. Normal flow voids seen within the vertebral arteries bilaterally. Disc levels: C2-C3: Unremarkable. C3-C4: Mild right greater than left uncovertebral hypertrophy without significant disc bulge. Mild right-sided facet degeneration. No spinal stenosis. Mild right C4 foraminal narrowing. Left neural foramina remains patent. C4-C5: Mild disc bulge with uncovertebral and endplate spurring. Flattening of the ventral thecal sac without significant spinal stenosis or cord deformity. Mild right C5 foraminal stenosis. Left neural foramina remains patent. C5-C6: Degenerative intervertebral disc space narrowing with diffuse disc osteophyte complex. Broad posterior component flattens and partially faces the ventral thecal sac with mild spinal stenosis. No more than minimal flattening of the ventral cord. Moderate left with mild right C6 foraminal narrowing. C6-C7: Congenital fusion of C6 and C7. No canal or foraminal stenosis. C7-T1: Mild disc bulge with right greater than left uncovertebral hypertrophy. Bilateral facet degeneration. No spinal stenosis. Mild to moderate right C8 foraminal narrowing. Left neural foramina remains patent. Visualized upper thoracic spine demonstrates no significant finding. IMPRESSION: MRI HEAD IMPRESSION: 1. Patchy and hazy T2/FLAIR hyperintensity involving the supratentorial cerebral white matter as above, nonspecific, but overall mild to moderate in nature. 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 Possible demyelinating disease is not entirely excluded, although appearance would be atypical for this process. No convincing changes of active demyelination. 2. Punctate 3 mm focus of enhancement involving the inferior left basal ganglia. Finding is of uncertain etiology or significance, and  could reflect a small vascular structure. Outpatient neurology follow-up with short interval follow-up MRI in 2-3 months to document stability recommended. 3. Otherwise negative brain MRI for age. No other  acute intracranial abnormality. MRI CERVICAL SPINE IMPRESSION: 1. Normal MRI appearance of the cervical spinal cord. No evidence for demyelinating disease. 2. 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 Associated mild-to-moderate right C4, C5, C6, and C8 foraminal stenosis, with moderate left C6 foraminal narrowing. Electronically Signed   By: Rise MuBenjamin  McClintock M.D.   On: 03/09/2021 23:48   MR Cervical Spine W or Wo Contrast  Result Date: 03/09/2021 CLINICAL DATA:  Initial evaluation for possible stroke versus demyelinating disease. EXAM: MRI HEAD WITHOUT AND WITH CONTRAST MRI CERVICAL SPINE WITHOUT AND WITH CONTRAST TECHNIQUE: Multiplanar, multiecho pulse sequences of the brain and surrounding structures, and cervical spine, to include the craniocervical junction and cervicothoracic junction, were obtained without and with intravenous contrast. CONTRAST:  7mL GADAVIST GADOBUTROL 1 MMOL/ML IV SOLN COMPARISON:  Prior CT from earlier the same day. FINDINGS: MRI HEAD FINDINGS Brain: Cerebral volume within normal limits for age. Patchy and hazy T2/FLAIR hyperintensity seen involving the periventricular and deep white matter of both cerebral hemispheres, nonspecific, but overall mild to moderate in nature. Overall, changes are most pronounced within the right frontal lobe (series 11, image 18). Few corresponding areas of T1 hypointensity noted about the basal ganglia (series 16, images 28, 31), favored to reflect small remote lacunar infarcts. No infratentorial signal abnormality. No abnormal enhancement to suggest acute or subacute ischemia or active demyelination. Gray-white matter differentiation maintained. No encephalomalacia to suggest chronic cortical infarction. No  foci of susceptibility artifact to suggest acute or chronic intracranial hemorrhage. No definite mass lesion. No mass effect or midline shift. Ventricles normal in size without hydrocephalus. Cavum et septum pellucidum noted. No extra-axial fluid collection. Pituitary gland and suprasellar region within normal limits. Midline structures intact and normal. Prominent calcification noted along the anterior falx. There is an apparent punctate 3 mm focus of enhancement involving the inferior left basal ganglia (series 18, image 21). Finding of uncertain etiology or significance. No other abnormal enhancement. Vascular: Major intracranial vascular flow voids are maintained. Skull and upper cervical spine: Craniocervical junction within normal limits. Bone marrow signal intensity normal. No scalp soft tissue abnormality. Sinuses/Orbits: Globes and orbital soft tissues within normal limits. Paranasal sinuses are largely clear. No mastoid effusion. Inner ear structures grossly normal. Other: None. MRI CERVICAL SPINE FINDINGS Alignment: Straightening with mild reversal of the normal cervical lordosis. Trace anterolisthesis of C4 on C5, with trace retrolisthesis of C5 on C6. 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 Vertebral body height maintained without acute or chronic fracture. Bone marrow signal intensity within normal limits. No discrete or worrisome osseous lesions. No abnormal marrow edema or enhancement. Cord: Signal intensity within the cervical spinal cord is normal. Normal cord signal changes to suggest demyelinating disease. Normal cord caliber morphology. No abnormal enhancement. Posterior Fossa, vertebral arteries, paraspinal tissues: Craniocervical junction within normal limits. Paraspinous and prevertebral soft tissues within normal limits. Normal flow voids seen within the vertebral arteries bilaterally. Disc levels: C2-C3: Unremarkable. C3-C4:  Mild right greater than left uncovertebral hypertrophy without significant disc bulge. Mild right-sided facet degeneration. No spinal stenosis. Mild right C4 foraminal narrowing. Left neural foramina remains patent. C4-C5: Mild disc bulge with uncovertebral and endplate spurring. Flattening of the ventral thecal sac without significant spinal stenosis or cord deformity. Mild right C5 foraminal stenosis. Left neural foramina remains patent. C5-C6: Degenerative intervertebral disc space narrowing with diffuse disc osteophyte complex. Broad posterior component flattens and partially faces the ventral thecal  sac with mild spinal stenosis. No more than minimal flattening of the ventral cord. Moderate left with mild right C6 foraminal narrowing. C6-C7: Congenital fusion of C6 and C7. No canal or foraminal stenosis. C7-T1: Mild disc bulge with right greater than left uncovertebral hypertrophy. Bilateral facet degeneration. No spinal stenosis. Mild to moderate right C8 foraminal narrowing. Left neural foramina remains patent. Visualized upper thoracic spine demonstrates no significant finding. IMPRESSION: MRI HEAD IMPRESSION: 1. Patchy and hazy T2/FLAIR hyperintensity involving the supratentorial cerebral white matter as above, nonspecific, but overall mild to moderate in nature. 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 Possible demyelinating disease is not entirely excluded, although appearance would be atypical for this process. No convincing changes of active demyelination. 2. Punctate 3 mm focus of enhancement involving the inferior left basal ganglia. Finding is of uncertain etiology or significance, and could reflect a small vascular structure. Outpatient neurology follow-up with short interval follow-up MRI in 2-3 months to document stability recommended. 3. Otherwise negative brain MRI for age. No other acute intracranial abnormality. MRI CERVICAL SPINE  IMPRESSION: 1. Normal MRI appearance of the cervical spinal cord. No evidence for demyelinating disease. 2. 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 Associated mild-to-moderate right C4, C5, C6, and C8 foraminal stenosis, with moderate left C6 foraminal narrowing. Electronically Signed   By: Rise Mu M.D.   On: 03/09/2021 23:48   DG Chest Portable 1 View  Result Date: 03/09/2021 CLINICAL DATA:  Shortness of breath.  Pharyngitis. EXAM: PORTABLE CHEST 1 VIEW COMPARISON:  None. FINDINGS: The heart size and mediastinal contours are within normal limits. Both lungs are clear. The visualized skeletal structures are unremarkable. IMPRESSION: No active disease. Electronically Signed   By: Beckie Salts M.D.   On: 03/09/2021 14:43   ECHOCARDIOGRAM COMPLETE  Result Date: 03/10/2021    ECHOCARDIOGRAM REPORT   Patient Name:   [facility] Date of Exam: 03/10/2021 Medical Rec #:  [id number]      Height:       63.0 in Accession #:    [id number]     Weight:       165.6 lb Date of Birth:  Jul 25, 1961      BSA:          1.784 m Patient Age:    59 years       BP:           183/97 mmHg Patient Gender: F              HR:           74 bpm. Exam Location:  Inpatient Procedure: 2D Echo, Cardiac Doppler and Color Doppler Indications:    Stroke l63.9  History:        Patient has no prior history of Echocardiogram examinations.                 Risk Factors:Hypertension.  Sonographer:    Celesta Gentile RCS Referring Phys: [phone] SAIMA RIZWAN IMPRESSIONS  1. Left ventricular ejection fraction, by estimation, is 60 to 65%. The left ventricle has normal function. The left ventricle has no regional wall motion abnormalities. Left ventricular diastolic parameters are consistent with Grade I diastolic dysfunction (impaired relaxation).  2. Right ventricular systolic function is normal. The right ventricular size is normal.  3. The mitral valve is normal in structure. Trivial mitral valve  regurgitation. No evidence of mitral stenosis.  4. The aortic valve is normal in structure.  Aortic valve regurgitation is not visualized. No aortic stenosis is present. FINDINGS  Left Ventricle: Left ventricular ejection fraction, by estimation, is 60 to 65%. The left ventricle has normal function. The left ventricle has no regional wall motion abnormalities. The left ventricular internal cavity size was normal in size. There is  no left ventricular hypertrophy. Left ventricular diastolic parameters are consistent with Grade I diastolic dysfunction (impaired relaxation). Right Ventricle: The right ventricular size is normal. No increase in right ventricular wall thickness. Right ventricular systolic function is normal. Left Atrium: Left atrial size was normal in size. Right Atrium: Right atrial size was normal in size. Pericardium: There is no evidence of pericardial effusion. Mitral Valve: The mitral valve is normal in structure. Trivial mitral valve regurgitation. No evidence of mitral valve stenosis. Tricuspid Valve: The tricuspid valve is normal in structure. Tricuspid valve regurgitation is trivial. Aortic Valve: The aortic valve is normal in structure. Aortic valve regurgitation is not visualized. No aortic stenosis is present. Pulmonic Valve: The pulmonic valve was normal in structure. Pulmonic valve regurgitation is not visualized. Aorta: The aortic root and ascending aorta are structurally normal, with no evidence of dilitation. IAS/Shunts: The atrial septum is grossly normal.  LEFT VENTRICLE PLAX 2D LVIDd:         5.40 cm  Diastology LVIDs:         3.00 cm  LV e' medial:    4.24 cm/s LV PW:         1.00 cm  LV E/e' medial:  11.4 LV IVS:        1.00 cm  LV e' lateral:   6.31 cm/s LVOT diam:     1.80 cm  LV E/e' lateral: 7.7 LV SV:         54 LV SV Index:   30 LVOT Area:     2.54 cm  RIGHT VENTRICLE RV S prime:     14.50 cm/s TAPSE (M-mode): 2.0 cm LEFT ATRIUM           Index       RIGHT ATRIUM            Index LA diam:      3.10 cm 1.74 cm/m  RA Area:     11.50 cm LA Vol (A2C): 34.2 ml 19.17 ml/m RA Volume:   25.10 ml  14.07 ml/m LA Vol (A4C): 49.3 ml 27.63 ml/m  AORTIC VALVE LVOT Vmax:   90.90 cm/s LVOT Vmean:  59.400 cm/s LVOT VTI:    0.211 m  AORTA Ao Root diam: 3.10 cm MITRAL VALVE MV Area (PHT): 4.89 cm    SHUNTS MV Decel Time: 155 msec    Systemic VTI:  0.21 m MV E velocity: 48.30 cm/s  Systemic Diam: 1.80 cm MV A velocity: 74.70 cm/s MV E/A ratio:  0.65 Kristeen Miss MD Electronically signed by Kristeen Miss MD Signature Date/Time: 03/10/2021/12:40:22 PM    Final    VAS US CAROTID  Result Date: 03/10/2021 Carotid Arterial Duplex Study Patient Name:  [facility]  Date of Exam:   03/10/2021 Medical Rec #: [id number]       Accession #:    [id number] Date of Birth: Sep 02, 1960       Patient Gender: F Patient Age:   46Y Exam Location:  [facility] Procedure:      VAS US CAROTID Referring Phys: [id number] JARED M GARDNER --------------------------------------------------------------------------------  Indications:       Weakness and Left sided weakness and hoarse voice. Risk  Factors:      Hypertension. Comparison Study:  No prior study Performing Technologist: Sherren Kerns RVS  Examination Guidelines: A complete evaluation includes B-mode imaging, spectral Doppler, color Doppler, and power Doppler as needed of all accessible portions of each vessel. Bilateral testing is considered an integral part of a complete examination. Limited examinations for reoccurring indications may be performed as noted.  Right Carotid Findings: +----------+--------+--------+--------+------------------+------------------+           PSV cm/sEDV cm/sStenosisPlaque DescriptionComments           +----------+--------+--------+--------+------------------+------------------+ CCA Prox  94      26                                intimal thickening  +----------+--------+--------+--------+------------------+------------------+ CCA Distal104     27                                intimal thickening +----------+--------+--------+--------+------------------+------------------+ ICA Prox  48      16                                                   +----------+--------+--------+--------+------------------+------------------+ ICA Distal91      34                                                   +----------+--------+--------+--------+------------------+------------------+ ECA       104     16                                                   +----------+--------+--------+--------+------------------+------------------+ +----------+--------+-------+--------+-------------------+           PSV cm/sEDV cmsDescribeArm Pressure (mmHG) +----------+--------+-------+--------+-------------------+ [id number]                                         +----------+--------+-------+--------+-------------------+ +---------+--------+--+--------+--+ VertebralPSV cm/s87EDV cm/s24 +---------+--------+--+--------+--+  Left Carotid Findings: +----------+--------+--------+--------+------------------+------------------+           PSV cm/sEDV cm/sStenosisPlaque DescriptionComments           +----------+--------+--------+--------+------------------+------------------+ CCA Prox  99      26                                intimal thickening +----------+--------+--------+--------+------------------+------------------+ CCA Distal81      32                                intimal thickening +----------+--------+--------+--------+------------------+------------------+ ICA Prox  76      26                                                   +----------+--------+--------+--------+------------------+------------------+  ICA Distal94      36                                                    +----------+--------+--------+--------+------------------+------------------+ ECA       128     28                                                   +----------+--------+--------+--------+------------------+------------------+ +----------+--------+--------+--------+-------------------+           PSV cm/sEDV cm/sDescribeArm Pressure (mmHG) +----------+--------+--------+--------+-------------------+ [id number]                                          +----------+--------+--------+--------+-------------------+ +---------+--------+--+--------+--+ VertebralPSV cm/s78EDV cm/s24 +---------+--------+--+--------+--+   Summary: Right Carotid: The extracranial vessels were near-normal with only minimal wall                thickening or plaque. Left Carotid: The extracranial vessels were near-normal with only minimal wall               thickening or plaque. Vertebrals:  Bilateral vertebral arteries demonstrate antegrade flow. Subclavians: Normal flow hemodynamics were seen in bilateral subclavian              arteries. *See table(s) above for measurements and observations.     Preliminary     WJX:[id number] except as listed in admit H&P  Blood pressure (!) 189/112, pulse 88, temperature 98.1 F (36.7 C), temperature source Oral, resp. rate 18, height 5\' 3"  (1.6 m), weight 75.1 kg, SpO2 98 %.  PHYSICAL EXAM: Overall appearance:  Healthy appearing, in no distress.  Her breathing is clear.  Her voice is aphonic/breathy.  She is able to produce a fairly strong cough. Head:  Normocephalic, atraumatic. Ears: Right external ear is normal in appearance.  On the left there is a 1 and half centimeter slightly erythematous and swollen area along the conchal bowl and antitragus area.  There is no fluctuance.  External auditory canals are clear; tympanic membranes are intact in the middle ears are free of any effusion. Nose: External nose is healthy in appearance. Internal nasal exam free of any lesions  or obstruction. Oral Cavity/Pharynx:  There are no mucosal lesions or masses identified. Larynx/Hypopharynx: See below Neuro:  No identifiable neurologic deficits. Neck: No palpable neck masses.  She is slightly tender along the anterior border of the left sternocleidomastoid muscle, the upper portion.  There is no distinct mass palpable.  There is a small firm nodule palpable in the left thyroid.  Studies Reviewed: CT neck and chest reviewed.  Procedures: Flexible fiberoptic laryngoscopy was performed.  Topical Xylocaine/Afrin was applied to the nasal cavities in spray form.  The scope was passed down the right side without difficulty.  The nasal cavity and nasopharynx were clear.  The oropharynx and hypopharynx were unremarkable.  The right cord moves well.  The left cord is paralyzed in a paramedian position.  The left arytenoid is angled forward.  Airway is clear.  There is no significant pooling of secretions.   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  I  would recommend ultrasound of the thyroid to rule out a thyroid mass that could be 1 explanation.  If that is negative then this may be an idiopathic cord paralysis or possibly part of some systemic neuropathy.  This is concerning for a possible tickborne illness if she did in fact have a tick bite to the left ear.  This is also concerning for possible autoimmune disorder.   Code: J38.00 Code:  E04.1   Serena Colonel 03/10/2021, 7:14 PM

## 2021-03-10 NOTE — Evaluation (Signed)
Speech Language Pathology Evaluation Patient Details Name: Alexis Kerr MRN: [id number] DOB: 30-Jun-1961 Today's Date: 03/10/2021 Time: 1040-1100 SLP Time Calculation (min) (ACUTE ONLY): 20 min  Problem List:  Patient Active Problem List   Diagnosis Date Noted   Acute ischemic stroke (HCC) 03/10/2021   Elevated blood pressure reading 03/10/2021   Past Medical History:  Past Medical History:  Diagnosis Date   Hypertension    Kidney stone on left side    Past Surgical History:  Past Surgical History:  Procedure Laterality Date   CESAREAN SECTION     HPI:  Pt is a 60 y.o. female who presented with L sided weakness, hoarse voice, and difficulty swallowing liquids.  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 MRI brain: Punctate 3 mm focus of enhancement involving the inferior left  basal ganglia. CT neck soft tissue which demonstrated mildly enlarged and geterogenous palatine tonsils which may reflect tonsillitis. 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 Pt failed [facility] swallow screen since she stopped drinking. PMH: untreated HTN.   Assessment / Plan / Recommendation Clinical Impression  Pt participated in evaluation with her husband present. Both parties denied the pt having any baseline deficits in speech, language, or cognition. 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 Pt reported that her voice is far from from baseline and became intermittently tearful when  discussing its far proximity to baseline. [facility] Mental Status Examination was completed to evaluate the pt's cognitive-linguistic skills. She achieved a score of 30/30 which is within the normal limits of 27 or more out of 30 and her language skills were WNL. Pt's vocal parameters were assessed by maximum phonation time, s/z ratio, words per breath, reading, and conversational discourse. Results suggest impairments in vocal fold adduction as evidenced by reduced sustained phonation of vowels of 3-4 seconds (normal range 15-25), and s/z ratio of 4.4 (normal range 1-1.4) with a disparity of 4.96 seconds with /z/ and 22 seconds with /s/. She presented with moderate dysphonia characterized by reduced vocal intensity, a rough and breathy vocal quality, and reduced pitch variability. SLP is in agreement with otolaryngology consult and directly visualization of larynx.     SLP Assessment  SLP Recommendation/Assessment: Patient needs continued Speech Lanaguage Pathology Services SLP Visit Diagnosis: Aphonia (R49.1);Other (comment) (Dysphonia)    Follow Up Recommendations  Outpatient SLP    Frequency and Duration min 2x/week  2 weeks      SLP Evaluation Cognition  Overall Cognitive Status: Within Functional Limits for tasks assessed Orientation Level: Oriented X4 Attention: Focused;Sustained Focused Attention: Appears intact Sustained Attention: Appears intact Memory: Appears intact Awareness: Appears intact Problem Solving: Appears intact Executive Function: Reasoning;Sequencing;Organizing Reasoning: Appears intact Sequencing: Appears intact Organizing: Appears intact       Comprehension  Auditory Comprehension Overall Auditory Comprehension: Appears within functional limits for tasks assessed Yes/No Questions: Within Functional Limits Commands: Within Functional Limits Conversation: Complex    Expression Expression Primary Mode of Expression: Verbal Verbal  Expression Overall Verbal Expression: Appears within functional limits  for tasks assessed Initiation: No impairment Repetition: No impairment Naming: No impairment Pragmatics: No impairment   Oral / Motor  Oral Motor/Sensory Function Overall Oral Motor/Sensory Function: Mild impairment Facial ROM: Within Functional Limits Facial Symmetry: Within Functional Limits Facial Strength: Within Functional Limits Facial Sensation: Within Functional Limits Lingual ROM: Within Functional Limits Velum:  (mildly reduced elevation, but symmetrical) Mandible: Within Functional Limits Motor Speech Respiration: Impaired Level of Impairment: Conversation Phonation: Breathy;Hoarse;Low vocal intensity Other Voice Expression Comments:  Maximum phonation time for sustained vowel phonation was examined indirectly to determine laryngeal air flow. The patient sustained the vowels /a/, /i/, /u/, for 3.8, 4.7, and 3.8 seconds, respectively which are within the normal range of 15-25 seconds. The s/z ratio (the longest /s/ divided by the longest /z/) was used to assess the integrity of glottal closure upon phonation. The normal range for s/z ratio in adults is 1-1.4 seconds which indicates that the expiration time for /s/ closely matches the maximum phonation time for /z/. The patient's s/z ratio of 4.4 seconds falls outside of normal average range. This combined with phonation of /z/ being 4.96 seconds compared to 22 seconds with /s/ suggests vocal fold insufficiency. Pt demonstrated reduced pitch variation despite notable effort and repeated attempts. In order to evaluate how often the pt replenishes her breath during speech he was instructed to count from 1 to 50 in his normal speaking rate. It is normal for individuals to pause and replenish their breath every 7 to 13 words. Therefore, the patient's result, which showed that she paused on average every 6 words, suggests reduced respiratory control during speech and/or  increased loss of air during phonation.  Resonance: Within functional limits Articulation: Within functional limitis Intelligibility: Intelligibility reduced Word: 75-100% accurate Phrase: 75-100% accurate Sentence: 75-100% accurate Conversation: 75-100% accurate Motor Planning: Witnin functional limits   Jospeh Mangel I. Vear Clock, MS, CCC-SLP Acute Rehabilitation Services Office number [phone] Pager [phone]                   Scheryl Marten 03/10/2021, 1:25 PM

## 2021-03-10 NOTE — Evaluation (Addendum)
Clinical/Bedside Swallow Evaluation Patient Details  Name: Alexis Kerr MRN: [id number] Date of Birth: [birth year]  Today's Date: 03/10/2021 Time: SLP Start Time (ACUTE ONLY): 1021 SLP Stop Time (ACUTE ONLY): 1040 SLP Time Calculation (min) (ACUTE ONLY): 19 min  Past Medical History:  Past Medical History:  Diagnosis Date   Hypertension    Kidney stone on left side    Past Surgical History:  Past Surgical History:  Procedure Laterality Date   CESAREAN SECTION     HPI:  Pt is a 60 y.o. female who presented with L sided weakness, hoarse voice, and difficulty swallowing liquids.  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 MRI brain: Punctate 3 mm focus of enhancement involving the inferior left  basal ganglia. CT neck soft tissue which demonstrated mildly enlarged and geterogenous palatine tonsils which may reflect tonsillitis. 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 Pt failed [facility] swallow screen since she stopped drinking. PMH: untreated HTN.   Assessment / Plan / Recommendation Clinical Impression  Pt was seen for bedside swallow evaluation with her husband present. She was alert and cooperative during the evaluation without c/o pain. Pt demonstrated reduced vocal intensity and a breathy vocal quality, suggesting vocal fold insufficiency. Pt presented with symptoms of pharyngeal dysphagia characterized by multiple swallows with solids, reports of globus sensation (pt identified the  approximate level of the left pyriform sinus), sensation of nasal regurgitation with the initial bolus of thin liquids via straw, and signs of aspiration with regular texture solids after a liquid wash was used to improve sensed pharyngeal residue. A clear liquid diet will be initiated at this time and an instrumental assessment is recommended to further assess swallow physiology. SLP Visit Diagnosis: Dysphagia, pharyngeal phase (R13.13)    Aspiration Risk  Mild aspiration risk    Diet Recommendation Thin liquid (clear liquids)  Liquid Administration via: Cup;Straw Medication Administration: Whole with liquids as tolerated Supervision: Patient able to self feed Compensations: Slow rate;Small sips/bites Postural Changes: Seated upright at 90 degrees    Other  Recommendations Recommended Consults: Consider ENT evaluation Oral Care Recommendations: Oral care BID   Follow up Recommendations Outpatient SLP      Frequency and Duration min 2x/week  2 weeks       Prognosis Prognosis for Safe Diet Advancement: Good Barriers to Reach Goals: Severity of deficits      Swallow Study   General Date of Onset: 03/09/21 HPI: Pt is a 60 y.o. female who presented with L sided weakness, hoarse voice, and difficulty swallowing liquids.  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 MRI brain: Punctate 3 mm focus of enhancement involving the inferior left  basal ganglia. CT neck soft tissue which demonstrated mildly enlarged and geterogenous palatine tonsils which may reflect tonsillitis. Medialized left vocal cord with anteromedial deviation of the arytenoid cartilage concerning for left vocal cord paralysis with no evidence of a mass along the expected course of recurrent laryngeal  in the neck. Neurology (7/9)thought most likely explanation of her symptoms  is a tiny stroke in the medulla involving nucleus ambiguus and the spinal accessory nucleus which lies directly beneath it. Pt failed [facility] swallow screen since she stopped drinking. PMH: untreated HTN. Type of Study: Bedside Swallow Evaluation Previous Swallow Assessment: none Diet Prior to this Study: NPO Temperature Spikes Noted: No Respiratory Status: Room air History of Recent Intubation: No Behavior/Cognition: Alert;Cooperative;Pleasant mood Oral Cavity Assessment: Within Functional Limits Oral Care Completed by SLP: No Oral Cavity - Dentition: Adequate natural dentition Vision: Functional for self-feeding Self-Feeding Abilities: Able to feed self Patient Positioning: Upright in bed;Postural control adequate for testing Baseline Vocal Quality: Low vocal intensity Volitional Cough: Weak Volitional Swallow: Able to elicit    Oral/Motor/Sensory Function Overall Oral Motor/Sensory Function: Mild impairment Facial ROM: Within Functional Limits Facial Symmetry: Within Functional Limits Facial Strength: Within Functional Limits Facial Sensation: Within Functional Limits Lingual ROM: Within Functional Limits Velum:  (mildly reduced elevation, but symmetrical) Mandible: Within Functional Limits   Ice Chips Ice chips: Impaired Presentation: Spoon Pharyngeal Phase Impairments: Multiple swallows   Thin Liquid Thin Liquid: Impaired Presentation: Straw;Spoon;Cup Pharyngeal  Phase Impairments: Multiple swallows    Nectar Thick Nectar Thick Liquid: Not tested   Honey Thick Honey Thick Liquid: Not tested   Puree Puree: Impaired Presentation: Spoon Pharyngeal Phase Impairments: Multiple swallows   Solid     Solid: Impaired Presentation: Self Fed Pharyngeal Phase Impairments: Multiple swallows;Cough - Delayed     Kamry Faraci I. Vear Clock, MS, CCC-SLP Acute Rehabilitation Services Office number [phone] Pager [phone]  Scheryl Marten 03/10/2021,11:37 AM

## 2021-03-10 NOTE — Progress Notes (Signed)
Physical Therapy Note  PT eval complete with full note to follow;  Recommend home for transition out of hospital;  Overall independent with mobility and ambulation, including high level balance acts (stairs, braiding, weight shifts for learning the "moonwalk");  Pt's main concerns re: functioning are swallow function, vocal function, and LUE function;  She is an Radio producer, Interior and spatial designer, and Runner, broadcasting/film/video;  Will defer her therapy needs to Speech Therapy and Occupational Therapy;   Van Clines, PT  Acute Rehabilitation Services Pager [phone] Office [phone]

## 2021-03-10 NOTE — Progress Notes (Addendum)
[facility] Short progress note  This patient was admitted today by my colleague.  I have reviewed the chart, examined the patient, discussed the patient with the neurologist, the radiologist, and the ENT doctor.  Is a 60 year old female who states that on Friday she noticed mild swelling in her throat.  She woke up at 2 AM on Sunday morning and noticed her sore throat and "glands felt chubby".  Later on Sunday she checked COVID which was negative.  When she was taking a shower she realized that she was unable to raise her left arm above her head. It became harder to swallow later.  [facility] after which she noticed her phonation began to decrease to a point where she could only whisper.  She notes that she developed itching inside of her left ear on Wednesday and thought it was secondary to a bite.  While at the med center, she noticed that the area below her left ear below her jaw and upper neck began to get swollen and tender.  Currently she has a sore throat on the left side along with an earache and a taste of blood in the back of her throat.  She is still unable to raise her left arm above her head.  She also is complaining of  pain in her forehead.  States that she is unable to swallow solid food.  When she tried to swallow liquids earlier instead of going down her esophagus they came out of her nose.  On exam: I see a small pustule in the left external auditory canal, there is edema and tenderness in the left upper neck/below the mandibular angle.  Tonsils mildly enlarged.  No obvious abnormalities in oropharynx.  There is swelling and tenderness above the left upper scapular area in the trapezius muscle.  She is unable to lift her left arm actively and when done passively she is in too much pain to raise it more than about 110 degrees.  Principal Problem: Left vocal cord paralysis   Localized left-sided swelling in neck Pustule in left auditory  canal -The report mentions abnormalities on the MRI brain however, neurology does not feel that her various symptoms are related to a CVA and I agree -CT reveals left vocal cord paralysis-I have spoken with the radiologist, and the swelling in her left upper neck below her jaw does not correlate with anything significant on the CT soft tissue or the vasculature -Radiology and ENT both recommend obtaining a CT of the chest to further evaluate the lower and of the left laryngeal nerve-I have ordered this -ENT, Dr. Pollyann Kennedy, will assess her today -Start Cepacol and Chloraseptic for her sore throat  Left trapezius sprain - Large area of swelling and tenderness just above her left scapula is compatible with her inability to lift her arm fully - I have ordered Voltaren gel, ice and as needed ibuprofen addition to acetaminophen  Hyperlipidemia - LDL is 123 recommend diet and lifestyle modifications  Time: About 45 to 60 minutes taken in speaking with patient husband and multiple subspecialists

## 2021-03-10 NOTE — ED Notes (Signed)
Pt also c/o neck pain r/t bug bite on left side of neck. Pt requesting MD to look at it.  MD Julian Reil made aware.

## 2021-03-10 NOTE — ED Notes (Signed)
Pt c/o of HA. MD Julian Reil notified. Pt to receive IV tylenol from pharm.

## 2021-03-10 NOTE — ED Notes (Signed)
Patient ambulated to and from bathroom with a steady gait. Patient transferred to hospital.

## 2021-03-10 NOTE — Progress Notes (Signed)
   03/10/21 1640  Assess: MEWS Score  Temp 98 F (36.7 C)  BP (!) 205/95  Pulse Rate 94  ECG Heart Rate 96  Resp 15  Level of Consciousness Alert  SpO2 98 %  O2 Device Room Air  Assess: MEWS Score  MEWS Temp 0  MEWS Systolic 2  MEWS Pulse 0  MEWS RR 0  MEWS LOC 0  MEWS Score 2  MEWS Score Color Yellow  Assess: if the MEWS score is Yellow or Red  Were vital signs taken at a resting state? Yes  Focused Assessment No change from prior assessment  Early Detection of Sepsis Score *See Row Information* Low  MEWS guidelines implemented *See Row Information* No, previously yellow, continue vital signs every 4 hours  Treat  Pain Scale 0-10  Pain Score 6  Pain Type Acute pain  Pain Location Head  Pain Orientation Left  Pain Radiating Towards neck  Pain Descriptors / Indicators Aching  Pain Frequency Intermittent  Pain Onset Gradual  Patients Stated Pain Goal 2  Take Vital Signs  Increase Vital Sign Frequency  Yellow: Q 2hr X 2 then Q 4hr X 2, if remains yellow, continue Q 4hrs  Escalate  MEWS: Escalate Yellow: discuss with charge nurse/RN and consider discussing with provider and RRT  Notify: Charge Nurse/RN  Name of Charge Nurse/RN Notified Erin, RN  Date Charge Nurse/RN Notified 03/10/21  Time Charge Nurse/RN Notified 1700

## 2021-03-10 NOTE — ED Notes (Signed)
Neurologist at bedside. 

## 2021-03-10 NOTE — Progress Notes (Signed)
STROKE TEAM PROGRESS NOTE   SUBJECTIVE (INTERVAL HISTORY) Her husband is at the bedside.  Patient recounted HPI with me.  She stated that on Wednesday she had insect bite around left lateral side of earlobe, she is to have a reddish rash currently at left a year, she felt itchy but did not make too much of it.  For next several days she started to have behind the ear pain tenderness and fullness below left mastoid.  Yesterday morning she woke up around 2 AM feeling weird in the throat area, difficult to get a voice out, seems more hoarseness/high-pitched sound.  When she swallows, she had to push herself swallow, feeling food/drink slow passing on the left side of throat, however denies choking, vertigo, dizziness, nausea vomiting, ataxia, gait difficulty, numbness or weakness of arm or leg.  Then yesterday as time goes by, her voice become more difficult and currently only whispers.  Yesterday she also started to have difficulty fully lifting up his left arm feeding tightness and soreness of left shoulder, left shoulder blade and upper left back. MRI no acute infarct.   OBJECTIVE Temp:  [97.9 F (36.6 C)-98.2 F (36.8 C)] 98.2 F (36.8 C) (07/10 1200) Pulse Rate:  [72-86] 86 (07/10 1200) Cardiac Rhythm: Normal sinus rhythm (07/10 0818) Resp:  [12-17] 16 (07/10 1200) BP: (159-198)/(91-116) 182/111 (07/10 1200) SpO2:  [96 %-99 %] 99 % (07/10 1200) Weight:  [75.1 kg] 75.1 kg (07/10 0817)  No results for input(s): GLUCAP in the last 168 hours. Recent Labs  Lab 03/09/21 1431  NA 141  K 3.8  CL 105  CO2 27  GLUCOSE 94  BUN 15  CREATININE 0.72  CALCIUM 9.6   Recent Labs  Lab 03/09/21 1431  AST 17  ALT 17  ALKPHOS 72  BILITOT 0.7  PROT 7.4  ALBUMIN 4.6   Recent Labs  Lab 03/09/21 1431  WBC 7.3  NEUTROABS 4.5  HGB 14.0  HCT 41.0  MCV 87.4  PLT 246   No results for input(s): CKTOTAL, CKMB, CKMBINDEX, TROPONINI in the last 168 hours. No results for input(s): LABPROT, INR in  the last 72 hours. No results for input(s): COLORURINE, LABSPEC, PHURINE, GLUCOSEU, HGBUR, BILIRUBINUR, KETONESUR, PROTEINUR, UROBILINOGEN, NITRITE, LEUKOCYTESUR in the last 72 hours.  Invalid input(s): APPERANCEUR     Component Value Date/Time   CHOL 209 (H) 03/10/2021 0402   TRIG 90 03/10/2021 0402   HDL 68 03/10/2021 0402   CHOLHDL 3.1 03/10/2021 0402   VLDL 18 03/10/2021 0402   LDLCALC 123 (H) 03/10/2021 0402   Lab Results  Component Value Date   HGBA1C 5.5 03/10/2021   No results found for: LABOPIA, COCAINSCRNUR, LABBENZ, AMPHETMU, THCU, LABBARB  No results for input(s): ETH in the last 168 hours.  I have personally reviewed the radiological images below and agree with the radiology interpretations.  CT Head Wo Contrast  Result Date: 03/09/2021 CLINICAL DATA:  Neuro deficit, acute, stroke suspected EXAM: CT HEAD WITHOUT CONTRAST TECHNIQUE: Contiguous axial images were obtained from the base of the skull through the vertex without intravenous contrast. COMPARISON:  None. FINDINGS: Brain: No acute intracranial abnormality. Specifically, no hemorrhage, hydrocephalus, mass lesion, acute infarction, or significant intracranial injury. Vascular: No hyperdense vessel or unexpected calcification. Skull: No acute calvarial abnormality. Sinuses/Orbits: No acute findings Other: None IMPRESSION: No acute intracranial abnormality. Electronically Signed   By: Charlett NoseKevin  Dover M.D.   On: 03/09/2021 16:44   CT Soft Tissue Neck W Contrast  Result Date: 03/09/2021 CLINICAL  DATA:  Neck abscess, deep tissue. EXAM: CT NECK WITH CONTRAST TECHNIQUE: Multidetector CT imaging of the neck was performed using the standard protocol following the bolus administration of intravenous contrast. CONTRAST:  75mL OMNIPAQUE IOHEXOL 300 MG/ML  SOLN COMPARISON:  None. FINDINGS: Pharynx and larynx: Mildly enlarged and heterogeneous palatine tonsils, suggestive of tonsillitis. Salivary glands: No inflammation, mass, or stone.  Thyroid: Approximately 8 mm left thyroid nodule, which is not require further imaging follow-up per current guidelines. Lymph nodes: None enlarged or abnormal density. Vascular: Limited evaluation due to non arterial timing with major arteries in the neck appearing grossly patent. Limited intracranial: Negative. Visualized orbits: Negative. Mastoids and visualized paranasal sinuses: Clear. 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 Upper chest: Visualized lung apices are clear. IMPRESSION: 1. Mildly enlarged and heterogeneous palatine tonsils, which may reflect tonsillitis. Recommend correlation with direct inspection. No discrete drainable fluid collection. 2. 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 No evidence of a mass along the expected course of the recurrent laryngeal in the neck; however, the AP window is incompletely imaged on this study. Recommend non urgent follow-up chest CT with contrast to fully characterize. 3. 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 Electronically Signed   By: Feliberto Harts MD   On: 03/09/2021 17:17   MR ANGIO HEAD WO CONTRAST  Result Date: 03/10/2021 CLINICAL DATA:  Neuro deficit with stroke suspected EXAM: MRA HEAD WITHOUT CONTRAST TECHNIQUE: Angiographic images of the Circle of Willis were acquired using MRA technique without intravenous contrast. COMPARISON:  No pertinent prior exam. FINDINGS: Anterior circulation: Vessels are smooth and widely patent. Negative for aneurysm Posterior circulation: Vessels are smooth and widely patent. Negative for aneurysm Anatomic variants: None significant IMPRESSION: Normal MRA. Electronically Signed   By: Marnee Spring M.D.   On: 03/10/2021 07:33   MR Brain W and Wo Contrast  Result Date: 03/09/2021 CLINICAL DATA:  Initial evaluation for possible stroke versus demyelinating  disease. EXAM: MRI HEAD WITHOUT AND WITH CONTRAST MRI CERVICAL SPINE WITHOUT AND WITH CONTRAST TECHNIQUE: Multiplanar, multiecho pulse sequences of the brain and surrounding structures, and cervical spine, to include the craniocervical junction and cervicothoracic junction, were obtained without and with intravenous contrast. CONTRAST:  7mL GADAVIST GADOBUTROL 1 MMOL/ML IV SOLN COMPARISON:  Prior CT from earlier the same day. FINDINGS: MRI HEAD FINDINGS Brain: Cerebral volume within normal limits for age. Patchy and hazy T2/FLAIR hyperintensity seen involving the periventricular and deep white matter of both cerebral hemispheres, nonspecific, but overall mild to moderate in nature. Overall, changes are most pronounced within the right frontal lobe (series 11, image 18). Few corresponding areas of T1 hypointensity noted about the basal ganglia (series 16, images 28, 31), favored to reflect small remote lacunar infarcts. No infratentorial signal abnormality. No abnormal enhancement to suggest acute or subacute ischemia or active demyelination. Gray-white matter differentiation maintained. No encephalomalacia to suggest chronic cortical infarction. No foci of susceptibility artifact to suggest acute or chronic intracranial hemorrhage. No definite mass lesion. No mass effect or midline shift. Ventricles normal in size without hydrocephalus. Cavum et septum pellucidum noted. No extra-axial fluid collection. Pituitary gland and suprasellar region within normal limits. Midline structures intact and normal. Prominent calcification noted along the anterior falx. There is an apparent punctate 3 mm focus of enhancement involving the inferior left basal ganglia (series 18, image 21). Finding of uncertain etiology or significance. No other abnormal enhancement. Vascular: Major  intracranial vascular flow voids are maintained. Skull and upper cervical spine: Craniocervical junction within normal limits. Bone marrow signal  intensity normal. No scalp soft tissue abnormality. Sinuses/Orbits: Globes and orbital soft tissues within normal limits. Paranasal sinuses are largely clear. No mastoid effusion. Inner ear structures grossly normal. Other: None. MRI CERVICAL SPINE FINDINGS Alignment: Straightening with mild reversal of the normal cervical lordosis. Trace anterolisthesis of C4 on C5, with trace retrolisthesis of C5 on C6. 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 Vertebral body height maintained without acute or chronic fracture. Bone marrow signal intensity within normal limits. No discrete or worrisome osseous lesions. No abnormal marrow edema or enhancement. Cord: Signal intensity within the cervical spinal cord is normal. Normal cord signal changes to suggest demyelinating disease. Normal cord caliber morphology. No abnormal enhancement. Posterior Fossa, vertebral arteries, paraspinal tissues: Craniocervical junction within normal limits. Paraspinous and prevertebral soft tissues within normal limits. Normal flow voids seen within the vertebral arteries bilaterally. Disc levels: C2-C3: Unremarkable. C3-C4: Mild right greater than left uncovertebral hypertrophy without significant disc bulge. Mild right-sided facet degeneration. No spinal stenosis. Mild right C4 foraminal narrowing. Left neural foramina remains patent. C4-C5: Mild disc bulge with uncovertebral and endplate spurring. Flattening of the ventral thecal sac without significant spinal stenosis or cord deformity. Mild right C5 foraminal stenosis. Left neural foramina remains patent. C5-C6: Degenerative intervertebral disc space narrowing with diffuse disc osteophyte complex. Broad posterior component flattens and partially faces the ventral thecal sac with mild spinal stenosis. No more than minimal flattening of the ventral cord. Moderate left with mild right C6 foraminal narrowing. C6-C7: Congenital fusion  of C6 and C7. No canal or foraminal stenosis. C7-T1: Mild disc bulge with right greater than left uncovertebral hypertrophy. Bilateral facet degeneration. No spinal stenosis. Mild to moderate right C8 foraminal narrowing. Left neural foramina remains patent. Visualized upper thoracic spine demonstrates no significant finding. IMPRESSION: MRI HEAD IMPRESSION: 1. Patchy and hazy T2/FLAIR hyperintensity involving the supratentorial cerebral white matter as above, nonspecific, but overall mild to moderate in nature. 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 Possible demyelinating disease is not entirely excluded, although appearance would be atypical for this process. No convincing changes of active demyelination. 2. Punctate 3 mm focus of enhancement involving the inferior left basal ganglia. Finding is of uncertain etiology or significance, and could reflect a small vascular structure. Outpatient neurology follow-up with short interval follow-up MRI in 2-3 months to document stability recommended. 3. Otherwise negative brain MRI for age. No other acute intracranial abnormality. MRI CERVICAL SPINE IMPRESSION: 1. Normal MRI appearance of the cervical spinal cord. No evidence for demyelinating disease. 2. 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 Associated mild-to-moderate right C4, C5, C6, and C8 foraminal stenosis, with moderate left C6 foraminal narrowing. Electronically Signed   By: Rise Mu M.D.   On: 03/09/2021 23:48   MR Cervical Spine W or Wo Contrast  Result Date: 03/09/2021 CLINICAL DATA:  Initial evaluation for possible stroke versus demyelinating disease. EXAM: MRI HEAD WITHOUT AND WITH CONTRAST MRI CERVICAL SPINE WITHOUT AND WITH CONTRAST TECHNIQUE: Multiplanar, multiecho pulse sequences of the brain and surrounding structures, and cervical spine, to include the craniocervical junction and  cervicothoracic junction, were obtained without and with intravenous contrast. CONTRAST:  7mL GADAVIST GADOBUTROL 1 MMOL/ML IV SOLN COMPARISON:  Prior CT from earlier the same day. FINDINGS: MRI HEAD FINDINGS Brain: Cerebral volume  within normal limits for age. Patchy and hazy T2/FLAIR hyperintensity seen involving the periventricular and deep white matter of both cerebral hemispheres, nonspecific, but overall mild to moderate in nature. Overall, changes are most pronounced within the right frontal lobe (series 11, image 18). Few corresponding areas of T1 hypointensity noted about the basal ganglia (series 16, images 28, 31), favored to reflect small remote lacunar infarcts. No infratentorial signal abnormality. No abnormal enhancement to suggest acute or subacute ischemia or active demyelination. Gray-white matter differentiation maintained. No encephalomalacia to suggest chronic cortical infarction. No foci of susceptibility artifact to suggest acute or chronic intracranial hemorrhage. No definite mass lesion. No mass effect or midline shift. Ventricles normal in size without hydrocephalus. Cavum et septum pellucidum noted. No extra-axial fluid collection. Pituitary gland and suprasellar region within normal limits. Midline structures intact and normal. Prominent calcification noted along the anterior falx. There is an apparent punctate 3 mm focus of enhancement involving the inferior left basal ganglia (series 18, image 21). Finding of uncertain etiology or significance. No other abnormal enhancement. Vascular: Major intracranial vascular flow voids are maintained. Skull and upper cervical spine: Craniocervical junction within normal limits. Bone marrow signal intensity normal. No scalp soft tissue abnormality. Sinuses/Orbits: Globes and orbital soft tissues within normal limits. Paranasal sinuses are largely clear. No mastoid effusion. Inner ear structures grossly normal. Other: None. MRI CERVICAL SPINE FINDINGS  Alignment: Straightening with mild reversal of the normal cervical lordosis. Trace anterolisthesis of C4 on C5, with trace retrolisthesis of C5 on C6. 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 Vertebral body height maintained without acute or chronic fracture. Bone marrow signal intensity within normal limits. No discrete or worrisome osseous lesions. No abnormal marrow edema or enhancement. Cord: Signal intensity within the cervical spinal cord is normal. Normal cord signal changes to suggest demyelinating disease. Normal cord caliber morphology. No abnormal enhancement. Posterior Fossa, vertebral arteries, paraspinal tissues: Craniocervical junction within normal limits. Paraspinous and prevertebral soft tissues within normal limits. Normal flow voids seen within the vertebral arteries bilaterally. Disc levels: C2-C3: Unremarkable. C3-C4: Mild right greater than left uncovertebral hypertrophy without significant disc bulge. Mild right-sided facet degeneration. No spinal stenosis. Mild right C4 foraminal narrowing. Left neural foramina remains patent. C4-C5: Mild disc bulge with uncovertebral and endplate spurring. Flattening of the ventral thecal sac without significant spinal stenosis or cord deformity. Mild right C5 foraminal stenosis. Left neural foramina remains patent. C5-C6: Degenerative intervertebral disc space narrowing with diffuse disc osteophyte complex. Broad posterior component flattens and partially faces the ventral thecal sac with mild spinal stenosis. No more than minimal flattening of the ventral cord. Moderate left with mild right C6 foraminal narrowing. C6-C7: Congenital fusion of C6 and C7. No canal or foraminal stenosis. C7-T1: Mild disc bulge with right greater than left uncovertebral hypertrophy. Bilateral facet degeneration. No spinal stenosis. Mild to moderate right C8 foraminal narrowing. Left neural foramina remains  patent. Visualized upper thoracic spine demonstrates no significant finding. IMPRESSION: MRI HEAD IMPRESSION: 1. Patchy and hazy T2/FLAIR hyperintensity involving the supratentorial cerebral white matter as above, nonspecific, but overall mild to moderate in nature. 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 Possible demyelinating disease is not entirely excluded, although appearance would be atypical for this process. No convincing changes of active demyelination. 2. Punctate 3 mm focus of enhancement involving the inferior left basal ganglia. Finding is of uncertain etiology or significance, and could reflect  a small vascular structure. Outpatient neurology follow-up with short interval follow-up MRI in 2-3 months to document stability recommended. 3. Otherwise negative brain MRI for age. No other acute intracranial abnormality. MRI CERVICAL SPINE IMPRESSION: 1. Normal MRI appearance of the cervical spinal cord. No evidence for demyelinating disease. 2. 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 Associated mild-to-moderate right C4, C5, C6, and C8 foraminal stenosis, with moderate left C6 foraminal narrowing. Electronically Signed   By: Rise Mu M.D.   On: 03/09/2021 23:48   DG Chest Portable 1 View  Result Date: 03/09/2021 CLINICAL DATA:  Shortness of breath.  Pharyngitis. EXAM: PORTABLE CHEST 1 VIEW COMPARISON:  None. FINDINGS: The heart size and mediastinal contours are within normal limits. Both lungs are clear. The visualized skeletal structures are unremarkable. IMPRESSION: No active disease. Electronically Signed   By: Beckie Salts M.D.   On: 03/09/2021 14:43   ECHOCARDIOGRAM COMPLETE  Result Date: 03/10/2021    ECHOCARDIOGRAM REPORT   Patient Name:   [facility] Date of Exam: 03/10/2021 Medical Rec #:  [id number]      Height:       63.0 in Accession #:    [id number]     Weight:       165.6 lb  Date of Birth:  01/16/1961      BSA:          1.784 m Patient Age:    59 years       BP:           183/97 mmHg Patient Gender: F              HR:           74 bpm. Exam Location:  Inpatient Procedure: 2D Echo, Cardiac Doppler and Color Doppler Indications:    Stroke l63.9  History:        Patient has no prior history of Echocardiogram examinations.                 Risk Factors:Hypertension.  Sonographer:    Celesta Gentile RCS Referring Phys: [phone] SAIMA RIZWAN IMPRESSIONS  1. Left ventricular ejection fraction, by estimation, is 60 to 65%. The left ventricle has normal function. The left ventricle has no regional wall motion abnormalities. Left ventricular diastolic parameters are consistent with Grade I diastolic dysfunction (impaired relaxation).  2. Right ventricular systolic function is normal. The right ventricular size is normal.  3. The mitral valve is normal in structure. Trivial mitral valve regurgitation. No evidence of mitral stenosis.  4. The aortic valve is normal in structure. Aortic valve regurgitation is not visualized. No aortic stenosis is present. FINDINGS  Left Ventricle: Left ventricular ejection fraction, by estimation, is 60 to 65%. The left ventricle has normal function. The left ventricle has no regional wall motion abnormalities. The left ventricular internal cavity size was normal in size. There is  no left ventricular hypertrophy. Left ventricular diastolic parameters are consistent with Grade I diastolic dysfunction (impaired relaxation). Right Ventricle: The right ventricular size is normal. No increase in right ventricular wall thickness. Right ventricular systolic function is normal. Left Atrium: Left atrial size was normal in size. Right Atrium: Right atrial size was normal in size. Pericardium: There is no evidence of pericardial effusion. Mitral Valve: The mitral valve is normal in structure. Trivial mitral valve regurgitation. No evidence of mitral valve stenosis. Tricuspid Valve:  The tricuspid valve is normal in structure. Tricuspid valve regurgitation is trivial. Aortic Valve: The aortic  valve is normal in structure. Aortic valve regurgitation is not visualized. No aortic stenosis is present. Pulmonic Valve: The pulmonic valve was normal in structure. Pulmonic valve regurgitation is not visualized. Aorta: The aortic root and ascending aorta are structurally normal, with no evidence of dilitation. IAS/Shunts: The atrial septum is grossly normal.  LEFT VENTRICLE PLAX 2D LVIDd:         5.40 cm  Diastology LVIDs:         3.00 cm  LV e' medial:    4.24 cm/s LV PW:         1.00 cm  LV E/e' medial:  11.4 LV IVS:        1.00 cm  LV e' lateral:   6.31 cm/s LVOT diam:     1.80 cm  LV E/e' lateral: 7.7 LV SV:         54 LV SV Index:   30 LVOT Area:     2.54 cm  RIGHT VENTRICLE RV S prime:     14.50 cm/s TAPSE (M-mode): 2.0 cm LEFT ATRIUM           Index       RIGHT ATRIUM           Index LA diam:      3.10 cm 1.74 cm/m  RA Area:     11.50 cm LA Vol (A2C): 34.2 ml 19.17 ml/m RA Volume:   25.10 ml  14.07 ml/m LA Vol (A4C): 49.3 ml 27.63 ml/m  AORTIC VALVE LVOT Vmax:   90.90 cm/s LVOT Vmean:  59.400 cm/s LVOT VTI:    0.211 m  AORTA Ao Root diam: 3.10 cm MITRAL VALVE MV Area (PHT): 4.89 cm    SHUNTS MV Decel Time: 155 msec    Systemic VTI:  0.21 m MV E velocity: 48.30 cm/s  Systemic Diam: 1.80 cm MV A velocity: 74.70 cm/s MV E/A ratio:  0.65 Kristeen Miss MD Electronically signed by Kristeen Miss MD Signature Date/Time: 03/10/2021/12:40:22 PM    Final    VAS US CAROTID  Result Date: 03/10/2021 Carotid Arterial Duplex Study Patient Name:  [facility]  Date of Exam:   03/10/2021 Medical Rec #: [id number]       Accession #:    [id number] Date of Birth: [birth year]       Patient Gender: F Patient Age:   39Y Exam Location:  [facility] Procedure:      VAS US CAROTID Referring Phys: [id number] JARED M GARDNER --------------------------------------------------------------------------------   Indications:       Weakness and Left sided weakness and hoarse voice. Risk Factors:      Hypertension. Comparison Study:  No prior study Performing Technologist: Sherren Kerns RVS  Examination Guidelines: A complete evaluation includes B-mode imaging, spectral Doppler, color Doppler, and power Doppler as needed of all accessible portions of each vessel. Bilateral testing is considered an integral part of a complete examination. Limited examinations for reoccurring indications may be performed as noted.  Right Carotid Findings: +----------+--------+--------+--------+------------------+------------------+           PSV cm/sEDV cm/sStenosisPlaque DescriptionComments           +----------+--------+--------+--------+------------------+------------------+ CCA Prox  94      26                                intimal thickening +----------+--------+--------+--------+------------------+------------------+ CCA Distal104     27  intimal thickening +----------+--------+--------+--------+------------------+------------------+ ICA Prox  48      16                                                   +----------+--------+--------+--------+------------------+------------------+ ICA Distal91      34                                                   +----------+--------+--------+--------+------------------+------------------+ ECA       104     16                                                   +----------+--------+--------+--------+------------------+------------------+ +----------+--------+-------+--------+-------------------+           PSV cm/sEDV cmsDescribeArm Pressure (mmHG) +----------+--------+-------+--------+-------------------+ [id number]                                         +----------+--------+-------+--------+-------------------+ +---------+--------+--+--------+--+ VertebralPSV cm/s87EDV cm/s24  +---------+--------+--+--------+--+  Left Carotid Findings: +----------+--------+--------+--------+------------------+------------------+           PSV cm/sEDV cm/sStenosisPlaque DescriptionComments           +----------+--------+--------+--------+------------------+------------------+ CCA Prox  99      26                                intimal thickening +----------+--------+--------+--------+------------------+------------------+ CCA Distal81      32                                intimal thickening +----------+--------+--------+--------+------------------+------------------+ ICA Prox  76      26                                                   +----------+--------+--------+--------+------------------+------------------+ ICA Distal94      36                                                   +----------+--------+--------+--------+------------------+------------------+ ECA       128     28                                                   +----------+--------+--------+--------+------------------+------------------+ +----------+--------+--------+--------+-------------------+           PSV cm/sEDV cm/sDescribeArm Pressure (mmHG) +----------+--------+--------+--------+-------------------+ [id number]                                          +----------+--------+--------+--------+-------------------+ +---------+--------+--+--------+--+  VertebralPSV cm/s78EDV cm/s24 +---------+--------+--+--------+--+   Summary: Right Carotid: The extracranial vessels were near-normal with only minimal wall                thickening or plaque. Left Carotid: The extracranial vessels were near-normal with only minimal wall               thickening or plaque. Vertebrals:  Bilateral vertebral arteries demonstrate antegrade flow. Subclavians: Normal flow hemodynamics were seen in bilateral subclavian              arteries. *See table(s) above for measurements and observations.      Preliminary       PHYSICAL EXAM  Temp:  [97.9 F (36.6 C)-98.2 F (36.8 C)] 98.2 F (36.8 C) (07/10 1200) Pulse Rate:  [72-86] 86 (07/10 1200) Resp:  [12-17] 16 (07/10 1200) BP: (159-198)/(91-116) 182/111 (07/10 1200) SpO2:  [96 %-99 %] 99 % (07/10 1200) Weight:  [75.1 kg] 75.1 kg (07/10 0817)  General - Well nourished, well developed, in no apparent distress.  Ophthalmologic - fundi not visualized due to noncooperation.  Cardiovascular - Regular rhythm and rate.  Neck - tenderness and fullness left neck below left mastoid. Mild left occipital nerve tenderness on palpation   Mental Status -  Level of arousal and orientation to time, place, and person were intact. Language including expression, naming, repetition, comprehension was assessed and found intact. Attention span and concentration were normal. Fund of Knowledge was assessed and was intact.  Cranial Nerves II - XII - II - Visual field intact OU. III, IV, VI - Extraocular movements intact. V - Facial sensation intact bilaterally. VII - Facial movement intact bilaterally. VIII - Hearing & vestibular intact bilaterally. Left ear solitary reddish rash, itchy per pt X - Palate elevates symmetrically. Whisper voice on talking XI - Chin turning & shoulder shrug intact bilaterally. XII - Tongue protrusion intact.  Motor Strength - The patient's strength was normal in all extremities except left deltoid 4/5.  Bulk was normal and fasciculations were absent.   Motor Tone - Muscle tone was assessed at the neck and appendages and was normal.  Reflexes - The patient's reflexes were symmetrical in all extremities and she had no pathological reflexes.  Sensory - Light touch, temperature/pinprick were assessed and were symmetrical.    Coordination - The patient had normal movements in the hands and feet with no ataxia or dysmetria.  Tremor was absent.  Gait and Station - deferred.   ASSESSMENT/PLAN Ms. Alexis Kerr is a  60 y.o. female with history of HTN admitted for abnormal feeling of throat, left neck/back of ear tenderness, whisper voice, left shoulder weakness.   Whisper voice with left neck tenderness and left shoulder weakness, etiology unclear but concerning for focal process. 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   CT neg MRI and MRA neg CUS neg 2D Echo  EF 60-65% LDL 123 HgbA1c 5.5 SCDs for VTE prophylaxis No antithrombotic prior to admission, on aspirin 81 mg daily for now.  Recommend ENT consultation Recommend chest CT to evaluate left recurrent laryngeal nerve Therapy recommendations:  pending Disposition:  pending  Hypertension BP still  High  Gradually normalize BP Add norvasc and lisinopril for BP management  Long term BP goal normotensive  Hyperlipidemia Home meds:  none  LDL 123, goal < 100 Will discuss with her about starting statin here or by her PCP  Other Risk Factors Use Mirena  Other Active Problems   Hospital day # 0  I discussed with Dr. Yetta Barre Radiology and Dr. Butler Denmark Hospitalist. I spent  35 minutes in total face-to-face time with the patient, more than 50% of which was spent in counseling and coordination of care, reviewing test results, images and medication, and discussing the diagnosis, treatment plan and potential prognosis. This patient's care requiresreview of multiple databases, neurological assessment, discussion with family, other specialists and medical decision making of high complexity.   Marvel Plan, MD PhD Stroke Neurology 03/10/2021 4:02 PM    To  contact Stroke Continuity provider, please refer to [email or web address]. After hours, contact General Neurology

## 2021-03-10 NOTE — Evaluation (Signed)
Physical Therapy Evaluation Patient Details Name: Alexis Kerr MRN: [id number] DOB: [birth year] Today's Date: 03/10/2021   History of Present Illness  Alexis Kerr is a 60 y.o. female who woke up in the middle of the night at 0200 and felt weird with a sore throat. Next morning she had a hoarse voice along with inability to lift her left arm above her head. She choked on her coffee and this worsened over thenext several hours, prompting an ED visit, per Dr. 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 MRI Brain can sometimes miss these tiny strokes in between the slices. I do not see another possible place where a single lesion could explain her symptoms."; [place]. with PMH significant for undiagnosed HTN, Kidney stones  Clinical Impression   Patient evaluated by Physical Therapy with no further acute PT needs identified, as she is independent with mobility and amb;  Initiated Stroke Education -- signs and symptoms, and modifiable and non-modifiable risk factors; Anticipate the need for reinforcement, at one point, pt indicated she was feeling overwhelm;  Pt's main concerns re: functioning are swallow function, vocal function, and LUE function; See below for any follow-up Physical Therapy or equipment needs. PT is signing off. Thank you for this referral.     Follow Up Recommendations Other (comment) (Outpt ST, Outpt OT)    Equipment Recommendations  None recommended by PT    Recommendations for Other Services OT consult;Speech consult     Precautions / Restrictions Precautions Precautions: Other (comment) Precaution Comments: Concerns re: swallowing      Mobility  Bed Mobility Overal bed mobility: Independent                  Transfers Overall transfer level: Independent                   Ambulation/Gait Ambulation/Gait assistance: Independent Gait Distance (Feet): 800 Feet Assistive device: None Gait Pattern/deviations: WFL(Within Functional Limits) Gait velocity: WFL   General Gait Details: No difficulties, including walking backwards and braiding  Stairs Stairs: Yes Stairs assistance: Independent;Modified independent (Device/Increase time) Stair Management: One rail Right;Forwards;Alternating pattern Number of Stairs: 12 General stair comments: no difficulty  Wheelchair Mobility    Modified Rankin (Stroke Patients Only) Modified Rankin (Stroke Patients Only) Pre-Morbid Rankin Score: No symptoms Modified Rankin: Slight disability     Balance Overall balance assessment: Independent                                           Pertinent Vitals/Pain Pain Assessment: 0-10 Pain Score: 5  Pain Location: Headache, center front and L side Pain Descriptors / Indicators: Aching;Headache;Constant Pain Intervention(s): Monitored during session    Home Living Family/patient expects to be discharged to:: Private residence Living Arrangements: Spouse/significant other Available Help at Discharge: Family Type of Home: House Home Access: Stairs to enter   Secretary/administrator of Steps: 4 (4 steps to enter home where she is staying in [place]; "many" steps to her home in [place]) Home Layout: One level Home Equipment: None      Prior Function Level of Independence: Independent         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, Interior and spatial designer, and Oceanographer  Extremity/Trunk Assessment   Upper Extremity Assessment Upper Extremity Assessment: Defer to OT evaluation;LUE deficits/detail LUE Deficits / Details: Reports some difficulty using LUE; lofts against gravity, but not far above 90deg    Lower Extremity Assessment Lower Extremity Assessment: Overall WFL for tasks assessed    Cervical / Trunk Assessment Cervical / Trunk  Assessment: Other exceptions Cervical / Trunk Exceptions: some pain with L rotation of neck  Communication   Communication: Expressive difficulties;Other (comment) (speaks in a whisper due to vocal cord paralysis)  Cognition Arousal/Alertness: Awake/alert Behavior During Therapy: WFL for tasks assessed/performed (one moment of tearfullness) Overall Cognitive Status: Within Functional Limits for tasks assessed                                        General Comments General comments (skin integrity, edema, etc.): Discussed signs and symptoms of CVA (BE FAST) and modifiable and non-modifiable risk factors    Exercises     Assessment/Plan    PT Assessment Patent does not need any further PT services (Highly recommend Speech Therapy and Occupational Therapy follow up)  PT Problem List         PT Treatment Interventions      PT Goals (Current goals can be found in the Care Plan section)  Acute Rehab PT Goals Patient Stated 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, directing, teaching PT Goal Formulation: All assessment and education complete, DC therapy    Frequency     Barriers to discharge        Co-evaluation               AM-PAC PT "6 Clicks" Mobility  Outcome Measure Help needed turning from your back to your side while in a flat bed without using bedrails?: None Help needed moving from lying on your back to sitting on the side of a flat bed without using bedrails?: None Help needed moving to and from a bed to a chair (including a wheelchair)?: None Help needed standing up from a chair using your arms (e.g., wheelchair or bedside chair)?: None Help needed to walk in hospital room?: None Help needed climbing 3-5 steps with a railing? : None 6 Click Score: 24    End of Session   Activity Tolerance: Patient tolerated treatment well Patient left: in chair;with call bell/phone within reach;with family/visitor present   PT Visit Diagnosis: Other  abnormalities of gait and mobility (R26.89)    Time: 5852-7782 PT Time Calculation (min) (ACUTE ONLY): 28 min   Charges:   PT Evaluation $PT Eval Low Complexity: 1 Low PT Treatments $Gait Training: 8-22 mins        Van Clines, PT  Acute Rehabilitation Services Pager [phone] Office [phone]   Levi Aland 03/10/2021, 10:49 AM

## 2021-03-10 NOTE — Progress Notes (Signed)
*  PRELIMINARY RESULTS* Echocardiogram 2D Echocardiogram has been performed.  Stacey Drain 03/10/2021, 11:59 AM

## 2021-03-10 NOTE — Progress Notes (Signed)
VASCULAR LAB    Carotid duplex has been performed.  See CV proc for preliminary results.   Remington Highbaugh, RVT 03/10/2021, 2:50 PM

## 2021-03-10 NOTE — ED Notes (Signed)
MD Julian Reil made away of b/p systolic in 190s and diastolic in 100's. Per Neuro pt allowed permissive HTN to 220.

## 2021-03-11 ENCOUNTER — Inpatient Hospital Stay (HOSPITAL_COMMUNITY): Payer: BC Managed Care – PPO

## 2021-03-11 DIAGNOSIS — R490 Dysphonia: Secondary | ICD-10-CM

## 2021-03-11 DIAGNOSIS — J3800 Paralysis of vocal cords and larynx, unspecified: Secondary | ICD-10-CM

## 2021-03-11 DIAGNOSIS — E7800 Pure hypercholesterolemia, unspecified: Secondary | ICD-10-CM

## 2021-03-11 LAB — RAPID URINE DRUG SCREEN, HOSP PERFORMED
Amphetamines: NOT DETECTED
Barbiturates: NOT DETECTED
Benzodiazepines: POSITIVE — AB
Cocaine: NOT DETECTED
Opiates: NOT DETECTED
Tetrahydrocannabinol: NOT DETECTED

## 2021-03-11 LAB — BASIC METABOLIC PANEL
Anion gap: 10 (ref 5–15)
BUN: 9 mg/dL (ref 6–20)
CO2: 25 mmol/L (ref 22–32)
Calcium: 9.6 mg/dL (ref 8.9–10.3)
Chloride: 104 mmol/L (ref 98–111)
Creatinine, Ser: 0.75 mg/dL (ref 0.44–1.00)
GFR, Estimated: >60 mL/min (ref >60)
Glucose, Bld: 135 mg/dL — ABNORMAL HIGH (ref 70–99)
Potassium: 3.5 mmol/L (ref 3.5–5.1)
Sodium: 139 mmol/L (ref 135–145)

## 2021-03-11 LAB — VITAMIN B12: Vitamin B-12: 167 pg/mL — ABNORMAL LOW (ref 180–914)

## 2021-03-11 LAB — C-REACTIVE PROTEIN: CRP: 0.5 mg/dL (ref <1.0)

## 2021-03-11 LAB — TSH: TSH: 1.113 u[IU]/mL (ref 0.350–4.500)

## 2021-03-11 LAB — SEDIMENTATION RATE: Sed Rate: 1 mm/hr (ref 0–22)

## 2021-03-11 MED ORDER — IBUPROFEN 100 MG/5ML PO SUSP
400.0000 mg | Freq: Four times a day (QID) | ORAL | Status: DC
  Filled 2021-03-11 (×5): qty 20

## 2021-03-11 MED ORDER — IBUPROFEN 400 MG PO TABS
400.0000 mg | ORAL_TABLET | Freq: Four times a day (QID) | ORAL | Status: DC
  Administered 2021-03-11 (×2): 400 mg via ORAL
  Filled 2021-03-11 (×2): qty 1

## 2021-03-11 MED ORDER — IBUPROFEN 100 MG/5ML PO SUSP
400.0000 mg | Freq: Four times a day (QID) | ORAL | Status: DC
  Filled 2021-03-11: qty 20

## 2021-03-11 MED ORDER — IBUPROFEN 100 MG/5ML PO SUSP
400.0000 mg | Freq: Four times a day (QID) | ORAL | Status: DC
  Filled 2021-03-11 (×2): qty 20

## 2021-03-11 MED ORDER — HYDROCORTISONE 1 % EX CREA
TOPICAL_CREAM | Freq: Two times a day (BID) | CUTANEOUS | Status: AC
  Filled 2021-03-11: qty 28

## 2021-03-11 MED ORDER — IBUPROFEN 400 MG PO TABS: 400.0000 mg | ORAL_TABLET | Freq: Four times a day (QID) | ORAL | Status: DC

## 2021-03-11 MED ORDER — DOXYCYCLINE HYCLATE 100 MG PO TABS
100.0000 mg | ORAL_TABLET | Freq: Two times a day (BID) | ORAL | Status: DC
  Administered 2021-03-11 – 2021-03-12 (×2): 100 mg via ORAL
  Filled 2021-03-11 (×2): qty 1

## 2021-03-11 MED ORDER — TRAZODONE HCL 50 MG PO TABS
50.0000 mg | ORAL_TABLET | Freq: Every evening | ORAL | Status: DC | PRN
  Administered 2021-03-11: 50 mg via ORAL
  Filled 2021-03-11: qty 1

## 2021-03-11 NOTE — Consult Note (Addendum)
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 No prior h/o fever or URI like symptoms.  Work up with left vocal cord paralysis. 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 No rashes suggestive of chicken pox. She says she has been vaccinated  Low suspicion for HSV related process Less likely related to TB given no risk factors   Left ear single pustular lesion Unclear if this is related to her vocal cord paralysis or a separate process She remember ? Insect bite in her left ear ( she did not see, so unsure if this is tick). Of note lyme is not endemic in [place].  Does not look like a Herpetic lesion  Unlikely this to be perichondritis given no tenderness on exam of external ear  Recommendations  OK to start Doxycyline 100mg  PO BID  given no clear cause of her vocal cord paralysis and she belongs to a Lyme endemic zone. Fu Lyme serology, HSV PCR and serology, EBV/CMA  DNA and serology  Fu RF, ANA, ESR and CRP FU acute hepatic panel, RPR  Rest of the management as per the primary team. Please call with questions or concerns.  Thank you for the consult  , MD Infectious Disease Physician [facility] [address] [place], [place] Phone: [phone]  Fax: [phone]  __________________________________________________________________________________________________________ HPI and Hospital Course: 69 Y O female with no significant PMH other than Kidney stone, ? HTN who is a 60 by profession who presented to the ED yesterday for MRI head and neck. She states that Wednesday morning she noticed an ?  insect bite ( she did not see)  in her left ear on followed by a bump in her ear canal/itching. This was followed by swelling in her throat on Friday morning and she woke up Saturday at 2 am with sore throat/lymph node swelling. It was associated with weakness in her left side of neck/trapezius area/swelling sensation. She was not able to raise her left arm while taking a shower. She later started  having  trouble swallowing solids. Her father in law who is  a physician advised her to go to the ED. She presented to the med center and noticed her voice was decreasing to the extent she can only whisper. She also had area in her left upper neck that became more swollen and tender.   She lives in [place] with her Husband and daughter. She came to visit her family in [place] 10 days back and says she was completely fine before last Wednesday. She has a dog at home who is not sick. No recent travel to other states or out of country except [place] currently. Denies any known tick bites in [place]. She is currently able to swallow liquid food and unable to swallow solid food.   Denies fevers, chills and sweats.  Denies cough/SOB and chest pain Denies nausea/vomiting,  abdominal pain and diarrhea Denies GU symptoms Denies back pain/joint pain/swelling and rashes Has periorbital headache. Denies blurry vision, loss of hearing or decreased hearing. Denies any new numbness/weakness and tingling sensation except weakness in her left arm and she finds it difficult to raise her left arm up against resistance  Has received all childhood vaccines including chickenpox Denies smoking, alcohol occasional,  denies illicit drugs.   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  The left arytenoid is angled forward."  ROS: 12 point ROS done with pertinent positives and negatives listed above  Past Medical History:  Diagnosis Date   Hypertension    Kidney stone on left side    Past Surgical History:  Procedure Laterality Date   CESAREAN SECTION       Scheduled Meds:   stroke: mapping our early stages of recovery book   Does not apply Once   amLODipine  10 mg Oral Daily   aspirin EC  81 mg Oral Daily   diclofenac Sodium  2 g Topical QID   ibuprofen  400 mg Oral Q6H   lisinopril  20 mg Oral Daily   Continuous Infusions:  sodium chloride 125 mL/hr at 03/11/21 1046   PRN Meds:.acetaminophen **OR** acetaminophen (TYLENOL) oral liquid 160 mg/5 mL **OR** acetaminophen, menthol-cetylpyridinium, phenol  Allergies  Allergen Reactions   Cephalexin    Sulfa Antibiotics    Tape     PAPER TAPE ONLY   Bacitracin Rash   Latex Rash   Social History   Socioeconomic History   Marital status: Married    Spouse name: Not on file   Number of children: Not on file   Years of education: Not on file   Highest education level: Not on file  Occupational History   Not on file  Tobacco  Use   Smoking status: Never   Smokeless tobacco: Never  Substance and Sexual Activity   Alcohol use: Not Currently   Drug use: Never   Sexual activity: Not Currently  Other Topics Concern   Not on file  Social History Narrative   Lives in [place], [place] and visiting family here   Social Determinants of Health   Financial Resource Strain: Not on file  Food Insecurity: Not on file  Transportation Needs: Not on file  Physical Activity: Not on file  Stress: Not on file  Social Connections: Not on file  Intimate Partner Violence: Not on file   Family h/o denies   Vitals  BP (!) 154/90 (BP Location: Left Arm)   Pulse 74   Temp 98.5 F (36.9 C) (Oral)   Resp 14   Ht $R'5\' 3"'Db$  (1.6 m)   Wt 75.1 kg   LMP  (LMP Unknown)   SpO2 95%   BMI 29.33 kg/m    Physical Exam Constitutional:  Not in acute distress , well built,     Comments: whispering voice , uvula is deviated in the left side. Difficult for her to open her mouth full   Cardiovascular:     Rate and Rhythm: Normal rate and regular rhythm.     Heart sounds: No murmur heard.   Pulmonary:     Effort: Pulmonary effort is normal.     Comments:   Abdominal:     Palpations: Abdomen is soft.     Tenderness: Non tender and non distended   Musculoskeletal:        General: No swelling or tenderness.   Skin: No other rashes in face, trunk and extremities     Comments: small pustule in the left external auditory canal, no tenderness in the pinna/swelling or edema  Neurological: awake, alert and oriented    General: weakness in the left arm (Power 4/5 in abducting left arm), Rest of the extremities 5/5   Psychiatric:        Mood and Affect: Mood normal.    Pertinent Microbiology Results for orders placed or performed during the hospital encounter of 03/09/21  Resp Panel by RT-PCR (Flu A&B, Covid) Nasopharyngeal Swab     Status: None   Collection Time: 03/09/21  2:31 PM   Specimen: Nasopharyngeal Swab;  Nasopharyngeal(NP) swabs in vial transport medium  Result Value Ref Range Status   SARS Coronavirus 2 by RT PCR NEGATIVE NEGATIVE Final    Comment: (NOTE) SARS-CoV-2 target nucleic acids are NOT DETECTED.  The SARS-CoV-2 RNA is generally detectable in upper respiratory specimens during the acute phase of infection. The lowest concentration of SARS-CoV-2 viral copies this assay can detect is 138 copies/mL. A negative result does not preclude SARS-Cov-2 infection and should not be used as the sole basis for treatment or other patient management decisions. A negative result may occur with  improper specimen collection/handling, submission of specimen other than nasopharyngeal swab, presence of viral mutation(s) within the areas targeted by this assay, and inadequate number of viral copies(<138 copies/mL). A negative result must be combined with clinical observations, patient history, and epidemiological information. The expected result is Negative.  Fact Sheet for Patients:  EntrepreneurPulse.com.au  Fact Sheet for Healthcare Providers:  IncredibleEmployment.be  This test is no t yet approved or cleared by the [place] FDA and  has been authorized for detection and/or diagnosis of SARS-CoV-2 by FDA under an Emergency Use Authorization (EUA). This EUA will remain  in effect (meaning this test can be used) for the duration of the COVID-19 declaration under Section 564(b)(1) of the Act, 21 U.S.C.section 360bbb-3(b)(1), unless the authorization is terminated  or revoked sooner.       Influenza A by PCR NEGATIVE NEGATIVE Final   Influenza B by PCR NEGATIVE NEGATIVE Final    Comment: (NOTE) The Xpert Xpress SARS-CoV-2/FLU/RSV plus assay is intended as an aid in the diagnosis of influenza from Nasopharyngeal swab specimens and should not be used as a sole basis for treatment. Nasal washings and aspirates are unacceptable for Xpert Xpress  SARS-CoV-2/FLU/RSV testing.  Fact Sheet for Patients: EntrepreneurPulse.com.au  Fact Sheet for Healthcare Providers: IncredibleEmployment.be  This test  is not yet approved or cleared by the [place] and has been authorized for detection and/or diagnosis of SARS-CoV-2 by FDA under an Emergency Use Authorization (EUA). This EUA will remain in effect (meaning this test can be used) for the duration of the COVID-19 declaration under Section 564(b)(1) of the Act, 21 U.S.C. section 360bbb-3(b)(1), unless the authorization is terminated or revoked.  Performed at [facility], [address] [place], [place] [address]   Group A Strep by PCR     Status: None   Collection Time: 03/09/21  4:39 PM   Specimen: Throat; Sterile Swab  Result Value Ref Range Status   Group A Strep by PCR NOT DETECTED NOT DETECTED Final    Comment: Performed at [facility], [address], [place], [place] [address]      Pertinent Lab seen by me: CBC Latest Ref Rng & Units 03/09/2021  WBC 4.0 - 10.5 K/uL 7.3  Hemoglobin 12.0 - 15.0 g/dL 14.0  Hematocrit 36.0 - 46.0 % 41.0  Platelets 150 - 400 K/uL 246   CMP Latest Ref Rng & Units 03/09/2021  Glucose 70 - 99 mg/dL 94  BUN 6 - 20 mg/dL 15  Creatinine 0.44 - 1.00 mg/dL 0.72  Sodium 135 - 145 mmol/L 141  Potassium 3.5 - 5.1 mmol/L 3.8  Chloride 98 - 111 mmol/L 105  CO2 22 - 32 mmol/L 27  Calcium 8.9 - 10.3 mg/dL 9.6  Total Protein 6.5 - 8.1 g/dL 7.4  Total Bilirubin 0.3 - 1.2 mg/dL 0.7  Alkaline Phos 38 - 126 U/L 72  AST 15 - 41 U/L 17  ALT 0 - 44 U/L 17    Pertinent Imagings/Other Imagings Plain films and CT images have been personally visualized and interpreted; radiology reports have been reviewed. Decision making incorporated into the Impression / Recommendations.  I spent more than 70  minutes for this patient encounter including review of prior medical  records/discussing diagnostics and treatment plan with the patient/family/coordinate care with primary/other specialits with greater than 50% of time in face to face encounter.   Electronically signed by:   Rosiland Oz, MD Infectious Disease Physician [facility] Pager: [phone]

## 2021-03-11 NOTE — Evaluation (Addendum)
Occupational Therapy Evaluation Patient Details Name: Alexis Kerr MRN: [id number] DOB: May 22, 1961 Today's Date: 03/11/2021    History of Present Illness Alexis Kerr is a 60 y.o. female who woke up in the middle of the night at 0200 and felt weird with a sore throat. Next morning she had a hoarse voice along with inability to lift her left arm above her head. She choked on her coffee and this worsened over thenext several hours, prompting an ED visit, per Dr. 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 MRI Brain can sometimes miss these tiny strokes in between the slices. I do not see another possible place where a single lesion could explain her symptoms."; she is visiting from [place]. with PMH significant for undiagnosed HTN, Kidney stones   Clinical Impression   PTA, pt was independent with ADL/IADL and functional mobility. She is a theater professor and vocalist. Pt reports and demonstrates improvement with LUE strength and ROM this date. She continues to demonstrate weakness in LUE primarily deltoid and rhomboid muscles. Provided pt with HEP Access Code: [email or web address]. Overall LUE 4/5 MMT and pt able to use LUE functionally.   Patient evaluated by Occupational Therapy with no further acute OT needs identified. All education has been completed and the patient has no further questions. See below for any follow-up Occupational Therapy or equipment needs. OT to sign off. Thank you for referral.      Follow Up Recommendations  No OT follow up    Equipment Recommendations  None recommended by OT    Recommendations for Other Services       Precautions / Restrictions Precautions Precautions: Other (comment) Precaution Comments: Concerns re: swallowing      Mobility Bed Mobility Overal bed mobility: Independent                   Transfers Overall transfer level: Independent                    Balance Overall balance assessment: Independent                                         ADL either performed or assessed with clinical judgement   ADL Overall ADL's : Independent                                       General ADL Comments: able to complete all ADL independently     Vision Baseline Vision/History: No visual deficits Patient Visual Report: No change from baseline       Perception     Praxis      Pertinent Vitals/Pain Pain Assessment: No/denies pain Pain Intervention(s): Monitored during session     Hand Dominance Right   Extremity/Trunk Assessment Upper Extremity Assessment Upper Extremity Assessment: LUE deficits/detail LUE Deficits / Details: AROM 160* shoulder flexion, shoulder abduction 90* with this motion pt reports feeling a "tightness" from her armpit to her elbow. Pt demonstrates deltoid weakness and slight weakness in rhomboid muscle. Provided pt with HEP and stretches for BUE to focus on rehabilitation of LUE. LUE is within functional limitations. Pt reports improvement with increased strength of LUE. Sensation is intact  LUE Sensation: WNL LUE Coordination: WNL   Lower Extremity Assessment Lower Extremity Assessment: Overall WFL for tasks assessed   Cervical / Trunk Assessment Cervical / Trunk Assessment: Other exceptions Cervical / Trunk Exceptions: some pain with L rotation of neck, noted tight sternocleidomastoid. educated pt on importance of gentle cervical stretching and gentle massage   Communication Communication Communication: Expressive difficulties;Other (comment) (speaks in a whisper due to vocal cord paralysis)   Cognition Arousal/Alertness: Awake/Kerr Behavior During Therapy: WFL for tasks assessed/performed Overall Cognitive Status: Within Functional Limits for tasks assessed                                      General Comments  vss    Exercises Exercises: Other exercises Other Exercises Other Exercises: Access Code: [email or web address] Shoulder Alphabet with Ball at Wall - 1 x daily - 7 x weekly - 3 sets - 10 reps  Seated Single Arm Shoulder External Rotation with Towel and Dumbbell - 1 x daily - 7 x weekly - 3 sets - 10 reps  Seated Shoulder Flexion - 1 x daily - 7 x weekly - 3 sets - 10 reps  Seated Shoulder Abduction with Bent Elbow - 1 x daily - 7 x weekly - 3 sets - 10 reps  Corner Pec Major Stretch - 1 x daily - 7 x weekly - 3 sets - 10 reps  Corner Pec Minor Stretch - 1 x daily - 7 x weekly - 3 sets - 10 reps   Shoulder Instructions      Home Living Family/patient expects to be discharged to:: Private residence Living Arrangements: Spouse/significant other Available Help at Discharge: Family Type of Home: House Home Access: Stairs to enter Secretary/administrator of Steps: 4 (4 steps to enter home where she is staying in [place]; "many" steps to her home in [place])   Home Layout: Multi-level               Home Equipment: None      Lives With: Spouse    Prior Functioning/Environment Level of Independence: Independent        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, Interior and spatial designer, and teacher        OT Problem List: Decreased strength;Decreased range of motion      OT Treatment/Interventions:      OT Goals(Current goals can be found in the care plan section) Acute Rehab OT Goals Patient Stated 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, directing, teaching OT Goal Formulation: With patient Time For Goal Achievement: 03/25/21 Potential to Achieve Goals: Good  OT Frequency:     Barriers to D/C:            Co-evaluation              AM-PAC OT "6 Clicks" Daily Activity     Outcome Measure Help from another person eating meals?: None Help from another person taking care of personal grooming?: None Help from another person toileting, which includes using toliet, bedpan, or urinal?:  None Help from another person bathing (including washing, rinsing, drying)?: None Help from another person to put on and taking off regular upper body clothing?: None Help from another person to put on and taking off regular lower body clothing?: None 6 Click Score: 24   End of Session Nurse Communication: Mobility status  Activity Tolerance: Patient tolerated treatment well Patient left: in bed;with call bell/phone within reach;with family/visitor present  OT Visit Diagnosis: Muscle weakness (generalized) (M62.81)                Time: 6468-0321 OT Time Calculation (min): 36 min Charges:  OT General Charges $OT Visit: 1 Visit OT Evaluation $OT Eval Low Complexity: 1 Low OT Treatments $Self Care/Home Management : 8-22 mins  Rosey Bath OTR/L Acute Rehabilitation Services Office: [phone]   Alexis Kerr 03/11/2021, 10:25 AM

## 2021-03-11 NOTE — Progress Notes (Addendum)
PROGRESS NOTE    Jerral BonitoDana Stauffer   ZOX:[id number]  DOB: Jun 23, 1961  DOA: 03/09/2021 PCP: System, Provider Not In   Brief Narrative:  Jerral BonitoDana Trickel is a 60 year old female who states that on Friday she noticed mild swelling in her throat.  She woke up at 2 AM on Sunday morning and noticed her sore throat and "glands felt chubby".  Later on Sunday she checked COVID which was negative.  When she was taking a shower she realized that she was unable to raise her left arm above her head. It became harder to swallow later.  [facility] after which she noticed her phonation began to decrease to a point where she could only whisper.   She notes that she developed itching inside of her left ear on Wednesday and thought it was secondary to a bite.  While at the med center, she noticed that the area below her left ear below her jaw and upper neck began to get swollen and tender.   Currently she has a sore throat on the left side along with an earache and a taste of blood in the back of her throat.  She is still unable to raise her left arm above her head.  She also is complaining of  pain in her forehead.   States that she is unable to swallow solid food.  When she tried to swallow liquids earlier instead of going down her esophagus they came out of her nose.   On exam: I see a small pustule in the left external auditory canal, there is edema and tenderness in the left upper neck/below the mandibular angle.  Tonsils mildly enlarged.  No obvious abnormalities in oropharynx.  There is swelling and tenderness above the left upper scapular area in the trapezius muscle.  She is unable to lift her left arm actively and when done passively she is in too much pain to raise it more than about 110 degrees.   Subjective: No change in phonation, swelling of throat or ear pain. Pain left left shoulder / upper back is improving and she can lift her arm a bit more.     Assessment & Plan:      Left vocal cord paralysis   Localized left-sided swelling in neck Pustule in left auditory canal -The report mentions abnormalities on the MRI brain however, neurology does not feel that her various symptoms are related to a CVA and I agree -CT reveals left vocal cord paralysis-I have spoken with the radiologist, and the swelling in her left upper neck below her jaw does not correlate with anything significant on the CT soft tissue or the vasculature -Radiology and ENT both recommend obtaining a CT of the chest to further evaluate the lower and of the left laryngeal nerve- CT completed and shows no abnormalites -ENT, Dr. Pollyann Kennedyosen, has evaluated her and done a laryngoscope-   The left cord is paralyzed in a paramedian position.  The left arytenoid is angled forward. - He is concerned about a palpable thyroid nodule- ultrasound shows left sided nodules which are quite small and cannot be biopsied -Started Cepacol and Chloraseptic for her sore throat - ENT also concerned about tick borne illness or autoimmune disorder- I have consulted ID today - 7/11> Still on clear liquids and not taking much- She has been on NS at 125 cc/hr since yesterday- cannot stop this yet -  MBS eval done today- > pharyngeal contraction and hyolaryngeal excursion, with reduced epiglottic inversion  and UES opening as a result>  Will continue with primarily liquids today, as pt still does not feel as comfortable taking more solid boluses   - Addendum: patient is willing to try Pureed foods- will order   Left trapezius sprain- unable to lift her left arm above her head - Large area of swelling and tenderness just above her left scapula is compatible with her inability to lift her arm fully - I have ordered Voltaren gel, ice and as needed ibuprofen addition to acetaminophen  -This is improving  Hyperlipidemia - LDL is 123 recommend diet and lifestyle modifications   Time spent in minutes: 45 DVT prophylaxis: SCDs Code  Status: full code Family Communication: husband Level of Care: Level of care: Telemetry Medical Disposition Plan:  Status is: Inpatient  Remains inpatient appropriate because:IV treatments appropriate due to intensity of illness or inability to take PO  Dispo: The patient is from: Home              Anticipated d/c is to: Home              Patient currently is not medically stable to d/c.   Difficult to place patient No      Consultants:  ENT Neuro Procedures:  Laryngoscopy  Antimicrobials:  Anti-infectives (From admission, onward)    None        Objective: Vitals:   03/10/21 2356 03/11/21 0435 03/11/21 0731 03/11/21 1149  BP: (!) 177/91 (!) 153/90 (!) 130/97 (!) 158/83  Pulse: 79 78 78 85  Resp: Temp: 98 F (36.7 C) 98 F (36.7 C) 98.4 F (36.9 C) 97.9 F (36.6 C)  TempSrc: Oral Oral Oral Oral  SpO2: 95% 96% 95% 95%  Weight:      Height:       No intake or output data in the 24 hours ending 03/11/21 1437 Filed Weights   03/09/21 1316 03/10/21 0817  Weight: 72.6 kg 75.1 kg    Examination: General exam: Appears comfortable  HEENT: PERRLA, oral mucosa moist, no sclera icterus or thrush Respiratory system: Clear to auscultation. Respiratory effort normal. Cardiovascular system: S1 & S2 heard, RRR.   Gastrointestinal system: Abdomen soft, non-tender, nondistended. Normal bowel sounds. Central nervous system: Alert and oriented. No focal neurological deficits. Extremities: No cyanosis, clubbing or edema Skin: No rashes or ulcers Psychiatry:  Mood & affect appropriate.          Data Reviewed: I have personally reviewed following labs and imaging studies  CBC: Recent Labs  Lab 03/09/21 1431  WBC 7.3  NEUTROABS 4.5  HGB 14.0  HCT 41.0  MCV 87.4  PLT 246   Basic Metabolic Panel: Recent Labs  Lab 03/09/21 1431  NA 141  K 3.8  CL 105  CO2 27  GLUCOSE 94  BUN 15  CREATININE 0.72  CALCIUM 9.6   GFR: Estimated Creatinine  Clearance: 75.3 mL/min (by C-G formula based on SCr of 0.72 mg/dL). Liver Function Tests: Recent Labs  Lab 03/09/21 1431  AST 17  ALT 17  ALKPHOS 72  BILITOT 0.7  PROT 7.4  ALBUMIN 4.6   No results for input(s): LIPASE, AMYLASE in the last 168 hours. No results for input(s): AMMONIA in the last 168 hours. Coagulation Profile: No results for input(s): INR, PROTIME in the last 168 hours. Cardiac Enzymes: No results for input(s): CKTOTAL, CKMB, CKMBINDEX, TROPONINI in the last 168 hours. BNP (last 3 results) No results for input(s): PROBNP in the last  8760 hours. HbA1C: Recent Labs    03/10/21 0402  HGBA1C 5.5   CBG: No results for input(s): GLUCAP in the last 168 hours. Lipid Profile: Recent Labs    03/10/21 0402  CHOL 209*  HDL 68  LDLCALC 123*  TRIG 90  CHOLHDL 3.1   Thyroid Function Tests: Recent Labs    03/11/21 1022  TSH 1.113   Anemia Panel: Recent Labs    03/11/21 1022  VITAMINB12 167*   Urine analysis: No results found for: COLORURINE, APPEARANCEUR, LABSPEC, PHURINE, GLUCOSEU, HGBUR, BILIRUBINUR, KETONESUR, PROTEINUR, UROBILINOGEN, NITRITE, LEUKOCYTESUR Sepsis Labs: @LABRCNTIP (procalcitonin:4,lacticidven:4) ) Recent Results (from the past 240 hour(s))  Resp Panel by RT-PCR (Flu A&B, Covid) Nasopharyngeal Swab     Status: None   Collection Time: 03/09/21  2:31 PM   Specimen: Nasopharyngeal Swab; Nasopharyngeal(NP) swabs in vial transport medium  Result Value Ref Range Status   SARS Coronavirus 2 by RT PCR NEGATIVE NEGATIVE Final    Comment: (NOTE) SARS-CoV-2 target nucleic acids are NOT DETECTED.  The SARS-CoV-2 RNA is generally detectable in upper respiratory specimens during the acute phase of infection. The lowest concentration of SARS-CoV-2 viral copies this assay can detect is 138 copies/mL. A negative result does not preclude SARS-Cov-2 infection and should not be used as the sole basis for treatment or other patient management  decisions. A negative result may occur with  improper specimen collection/handling, submission of specimen other than nasopharyngeal swab, presence of viral mutation(s) within the areas targeted by this assay, and inadequate number of viral copies(<138 copies/mL). A negative result must be combined with clinical observations, patient history, and epidemiological information. The expected result is Negative.  Fact Sheet for Patients:  05/10/21  Fact Sheet for Healthcare Providers:  BloggerCourse.com  This test is no t yet approved or cleared by the [employer] FDA and  has been authorized for detection and/or diagnosis of SARS-CoV-2 by FDA under an Emergency Use Authorization (EUA). This EUA will remain  in effect (meaning this test can be used) for the duration of the COVID-19 declaration under Section 564(b)(1) of the Act, 21 U.S.C.section 360bbb-3(b)(1), unless the authorization is terminated  or revoked sooner.       Influenza A by PCR NEGATIVE NEGATIVE Final   Influenza B by PCR NEGATIVE NEGATIVE Final    Comment: (NOTE) The Xpert Xpress SARS-CoV-2/FLU/RSV plus assay is intended as an aid in the diagnosis of influenza from Nasopharyngeal swab specimens and should not be used as a sole basis for treatment. Nasal washings and aspirates are unacceptable for Xpert Xpress SARS-CoV-2/FLU/RSV testing.  Fact Sheet for Patients: [facility]  Fact Sheet for Healthcare Providers: BloggerCourse.com  This test is not yet approved or cleared by the [employer] FDA and has been authorized for detection and/or diagnosis of SARS-CoV-2 by FDA under an Emergency Use Authorization (EUA). This EUA will remain in effect (meaning this test can be used) for the duration of the COVID-19 declaration under Section 564(b)(1) of the Act, 21 U.S.C. section 360bbb-3(b)(1), unless the  authorization is terminated or revoked.  Performed at [facility], [address] [place], [place]   Group A Strep by PCR     Status: None   Collection Time: 03/09/21  4:39 PM   Specimen: Throat; Sterile Swab  Result Value Ref Range Status   Group A Strep by PCR NOT DETECTED NOT DETECTED Final    Comment: Performed at [facility], [address], [place], [place]  Radiology Studies: CT Head Wo Contrast  Result Date: 03/09/2021 CLINICAL DATA:  Neuro deficit, acute, stroke suspected EXAM: CT HEAD WITHOUT CONTRAST TECHNIQUE: Contiguous axial images were obtained from the base of the skull through the vertex without intravenous contrast. COMPARISON:  None. FINDINGS: Brain: No acute intracranial abnormality. Specifically, no hemorrhage, hydrocephalus, mass lesion, acute infarction, or significant intracranial injury. Vascular: No hyperdense vessel or unexpected calcification. Skull: No acute calvarial abnormality. Sinuses/Orbits: No acute findings Other: None IMPRESSION: No acute intracranial abnormality. Electronically Signed   By: Charlett Nose M.D.   On: 03/09/2021 16:44   CT Soft Tissue Neck W Contrast  Result Date: 03/09/2021 CLINICAL DATA:  Neck abscess, deep tissue. EXAM: CT NECK WITH CONTRAST TECHNIQUE: Multidetector CT imaging of the neck was performed using the standard protocol following the bolus administration of intravenous contrast. CONTRAST:  75mL OMNIPAQUE IOHEXOL 300 MG/ML  SOLN COMPARISON:  None. FINDINGS: Pharynx and larynx: Mildly enlarged and heterogeneous palatine tonsils, suggestive of tonsillitis. Salivary glands: No inflammation, mass, or stone. Thyroid: Approximately 8 mm left thyroid nodule, which is not require further imaging follow-up per current guidelines. Lymph nodes: None enlarged or abnormal density. Vascular: Limited evaluation due to non arterial timing with major arteries in the neck  appearing grossly patent. Limited intracranial: Negative. Visualized orbits: Negative. Mastoids and visualized paranasal sinuses: Clear. 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 Upper chest: Visualized lung apices are clear. IMPRESSION: 1. Mildly enlarged and heterogeneous palatine tonsils, which may reflect tonsillitis. Recommend correlation with direct inspection. No discrete drainable fluid collection. 2. 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 No evidence of a mass along the expected course of the recurrent laryngeal in the neck; however, the AP window is incompletely imaged on this study. Recommend non urgent follow-up chest CT with contrast to fully characterize. 3. 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 Electronically Signed   By: Feliberto Harts MD   On: 03/09/2021 17:17   CT CHEST W CONTRAST  Result Date: 03/10/2021 CLINICAL DATA:  Upper respiratory illness. Evaluate recurrent laryngeal nerve. Abnormal left vocal cord on neck CT. EXAM: CT CHEST WITH CONTRAST TECHNIQUE: Multidetector CT imaging of the chest was performed during intravenous contrast administration. CONTRAST:  75mL OMNIPAQUE IOHEXOL 300 MG/ML  SOLN COMPARISON:  Neck CT of 1 day prior. Chest radiograph of 1 day prior. FINDINGS: Cardiovascular: Normal aortic caliber. Tortuous thoracic aorta. Borderline cardiomegaly. Mediastinum/Nodes: 7 mm left thyroid nodule Not clinically significant; no follow-up imaging recommended (ref: J Am Coll Radiol. 2015 Feb;12(2): 143-50). No mediastinal or hilar adenopathy.  No mass in the AP window. Esophageal fluid level including on 65/3. Lungs/Pleura: No pleural fluid. Mild left base scarring. No lung mass. Upper Abdomen: Gallbladder stones versus vicarious excretion of contrast is evidenced by dependent hyperattenuation. Suspect at least 1 too  small to characterize right hepatic lobe lesion inferiorly. Normal imaged portions of the spleen, stomach, pancreas, adrenal glands, kidneys. Musculoskeletal: No acute osseous abnormality. IMPRESSION: 1. No thoracic mass to explain laryngeal paralysis. 2.  No acute process in the chest. 3. Esophageal air fluid level suggests dysmotility or gastroesophageal reflux. 4. Cholelithiasis versus vicarious excretion of contrast. Electronically Signed   By: Jeronimo Greaves M.D.   On: 03/10/2021 18:49   MR ANGIO HEAD WO CONTRAST  Result Date: 03/10/2021 CLINICAL DATA:  Neuro deficit with stroke suspected EXAM: MRA HEAD WITHOUT CONTRAST TECHNIQUE: Angiographic images of the Circle of Willis were  acquired using MRA technique without intravenous contrast. COMPARISON:  No pertinent prior exam. FINDINGS: Anterior circulation: Vessels are smooth and widely patent. Negative for aneurysm Posterior circulation: Vessels are smooth and widely patent. Negative for aneurysm Anatomic variants: None significant IMPRESSION: Normal MRA. Electronically Signed   By: Marnee Spring M.D.   On: 03/10/2021 07:33   MR Brain W and Wo Contrast  Result Date: 03/09/2021 CLINICAL DATA:  Initial evaluation for possible stroke versus demyelinating disease. EXAM: MRI HEAD WITHOUT AND WITH CONTRAST MRI CERVICAL SPINE WITHOUT AND WITH CONTRAST TECHNIQUE: Multiplanar, multiecho pulse sequences of the brain and surrounding structures, and cervical spine, to include the craniocervical junction and cervicothoracic junction, were obtained without and with intravenous contrast. CONTRAST:  7mL GADAVIST GADOBUTROL 1 MMOL/ML IV SOLN COMPARISON:  Prior CT from earlier the same day. FINDINGS: MRI HEAD FINDINGS Brain: Cerebral volume within normal limits for age. Patchy and hazy T2/FLAIR hyperintensity seen involving the periventricular and deep white matter of both cerebral hemispheres, nonspecific, but overall mild to moderate in nature. Overall, changes are most  pronounced within the right frontal lobe (series 11, image 18). Few corresponding areas of T1 hypointensity noted about the basal ganglia (series 16, images 28, 31), favored to reflect small remote lacunar infarcts. No infratentorial signal abnormality. No abnormal enhancement to suggest acute or subacute ischemia or active demyelination. Gray-white matter differentiation maintained. No encephalomalacia to suggest chronic cortical infarction. No foci of susceptibility artifact to suggest acute or chronic intracranial hemorrhage. No definite mass lesion. No mass effect or midline shift. Ventricles normal in size without hydrocephalus. Cavum et septum pellucidum noted. No extra-axial fluid collection. Pituitary gland and suprasellar region within normal limits. Midline structures intact and normal. Prominent calcification noted along the anterior falx. There is an apparent punctate 3 mm focus of enhancement involving the inferior left basal ganglia (series 18, image 21). Finding of uncertain etiology or significance. No other abnormal enhancement. Vascular: Major intracranial vascular flow voids are maintained. Skull and upper cervical spine: Craniocervical junction within normal limits. Bone marrow signal intensity normal. No scalp soft tissue abnormality. Sinuses/Orbits: Globes and orbital soft tissues within normal limits. Paranasal sinuses are largely clear. No mastoid effusion. Inner ear structures grossly normal. Other: None. MRI CERVICAL SPINE FINDINGS Alignment: Straightening with mild reversal of the normal cervical lordosis. Trace anterolisthesis of C4 on C5, with trace retrolisthesis of C5 on C6. 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 Vertebral body height maintained without acute or chronic fracture. Bone marrow signal intensity within normal limits. No discrete or worrisome osseous lesions. No abnormal marrow edema or enhancement. Cord:  Signal intensity within the cervical spinal cord is normal. Normal cord signal changes to suggest demyelinating disease. Normal cord caliber morphology. No abnormal enhancement. Posterior Fossa, vertebral arteries, paraspinal tissues: Craniocervical junction within normal limits. Paraspinous and prevertebral soft tissues within normal limits. Normal flow voids seen within the vertebral arteries bilaterally. Disc levels: C2-C3: Unremarkable. C3-C4: Mild right greater than left uncovertebral hypertrophy without significant disc bulge. Mild right-sided facet degeneration. No spinal stenosis. Mild right C4 foraminal narrowing. Left neural foramina remains patent. C4-C5: Mild disc bulge with uncovertebral and endplate spurring. Flattening of the ventral thecal sac without significant spinal stenosis or cord deformity. Mild right C5 foraminal stenosis. Left neural foramina remains patent. C5-C6: Degenerative intervertebral disc space narrowing with diffuse disc osteophyte complex. Broad posterior component flattens and partially faces the ventral thecal sac with mild spinal stenosis. No  more than minimal flattening of the ventral cord. Moderate left with mild right C6 foraminal narrowing. C6-C7: Congenital fusion of C6 and C7. No canal or foraminal stenosis. C7-T1: Mild disc bulge with right greater than left uncovertebral hypertrophy. Bilateral facet degeneration. No spinal stenosis. Mild to moderate right C8 foraminal narrowing. Left neural foramina remains patent. Visualized upper thoracic spine demonstrates no significant finding. IMPRESSION: MRI HEAD IMPRESSION: 1. Patchy and hazy T2/FLAIR hyperintensity involving the supratentorial cerebral white matter as above, nonspecific, but overall mild to moderate in nature. 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 Possible demyelinating disease is not entirely excluded, although appearance would be atypical for this  process. No convincing changes of active demyelination. 2. Punctate 3 mm focus of enhancement involving the inferior left basal ganglia. Finding is of uncertain etiology or significance, and could reflect a small vascular structure. Outpatient neurology follow-up with short interval follow-up MRI in 2-3 months to document stability recommended. 3. Otherwise negative brain MRI for age. No other acute intracranial abnormality. MRI CERVICAL SPINE IMPRESSION: 1. Normal MRI appearance of the cervical spinal cord. No evidence for demyelinating disease. 2. 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 Associated mild-to-moderate right C4, C5, C6, and C8 foraminal stenosis, with moderate left C6 foraminal narrowing. Electronically Signed   By: Rise Mu M.D.   On: 03/09/2021 23:48   MR Cervical Spine W or Wo Contrast  Result Date: 03/09/2021 CLINICAL DATA:  Initial evaluation for possible stroke versus demyelinating disease. EXAM: MRI HEAD WITHOUT AND WITH CONTRAST MRI CERVICAL SPINE WITHOUT AND WITH CONTRAST TECHNIQUE: Multiplanar, multiecho pulse sequences of the brain and surrounding structures, and cervical spine, to include the craniocervical junction and cervicothoracic junction, were obtained without and with intravenous contrast. CONTRAST:  7mL GADAVIST GADOBUTROL 1 MMOL/ML IV SOLN COMPARISON:  Prior CT from earlier the same day. FINDINGS: MRI HEAD FINDINGS Brain: Cerebral volume within normal limits for age. Patchy and hazy T2/FLAIR hyperintensity seen involving the periventricular and deep white matter of both cerebral hemispheres, nonspecific, but overall mild to moderate in nature. Overall, changes are most pronounced within the right frontal lobe (series 11, image 18). Few corresponding areas of T1 hypointensity noted about the basal ganglia (series 16, images 28, 31), favored to reflect small remote lacunar infarcts. No infratentorial signal abnormality. No  abnormal enhancement to suggest acute or subacute ischemia or active demyelination. Gray-white matter differentiation maintained. No encephalomalacia to suggest chronic cortical infarction. No foci of susceptibility artifact to suggest acute or chronic intracranial hemorrhage. No definite mass lesion. No mass effect or midline shift. Ventricles normal in size without hydrocephalus. Cavum et septum pellucidum noted. No extra-axial fluid collection. Pituitary gland and suprasellar region within normal limits. Midline structures intact and normal. Prominent calcification noted along the anterior falx. There is an apparent punctate 3 mm focus of enhancement involving the inferior left basal ganglia (series 18, image 21). Finding of uncertain etiology or significance. No other abnormal enhancement. Vascular: Major intracranial vascular flow voids are maintained. Skull and upper cervical spine: Craniocervical junction within normal limits. Bone marrow signal intensity normal. No scalp soft tissue abnormality. Sinuses/Orbits: Globes and orbital soft tissues within normal limits. Paranasal sinuses are largely clear. No mastoid effusion. Inner ear structures grossly normal. Other: None. MRI CERVICAL SPINE FINDINGS Alignment: Straightening with mild reversal of the normal cervical lordosis. Trace anterolisthesis of C4 on C5, with trace retrolisthesis of C5 on C6. Vertebrae: Congenital fusion of the C6 and C7  vertebral bodies as well as their posterior elements noted. Vertebral body height maintained without acute or chronic fracture. Bone marrow signal intensity within normal limits. No discrete or worrisome osseous lesions. No abnormal marrow edema or enhancement. Cord: Signal intensity within the cervical spinal cord is normal. Normal cord signal changes to suggest demyelinating disease. Normal cord caliber morphology. No abnormal enhancement. Posterior Fossa, vertebral arteries, paraspinal tissues: Craniocervical junction  within normal limits. Paraspinous and prevertebral soft tissues within normal limits. Normal flow voids seen within the vertebral arteries bilaterally. Disc levels: C2-C3: Unremarkable. C3-C4: Mild right greater than left uncovertebral hypertrophy without significant disc bulge. Mild right-sided facet degeneration. No spinal stenosis. Mild right C4 foraminal narrowing. Left neural foramina remains patent. C4-C5: Mild disc bulge with uncovertebral and endplate spurring. Flattening of the ventral thecal sac without significant spinal stenosis or cord deformity. Mild right C5 foraminal stenosis. Left neural foramina remains patent. C5-C6: Degenerative intervertebral disc space narrowing with diffuse disc osteophyte complex. Broad posterior component flattens and partially faces the ventral thecal sac with mild spinal stenosis. No more than minimal flattening of the ventral cord. Moderate left with mild right C6 foraminal narrowing. C6-C7: Congenital fusion of C6 and C7. No canal or foraminal stenosis. C7-T1: Mild disc bulge with right greater than left uncovertebral hypertrophy. Bilateral facet degeneration. No spinal stenosis. Mild to moderate right C8 foraminal narrowing. Left neural foramina remains patent. Visualized upper thoracic spine demonstrates no significant finding. IMPRESSION: MRI HEAD IMPRESSION: 1. Patchy and hazy T2/FLAIR hyperintensity involving the supratentorial cerebral white matter as above, nonspecific, but overall mild to moderate in nature. 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 Possible demyelinating disease is not entirely excluded, although appearance would be atypical for this process. No convincing changes of active demyelination. 2. Punctate 3 mm focus of enhancement involving the inferior left basal ganglia. Finding is of uncertain etiology or significance, and could reflect a small vascular structure. Outpatient neurology  follow-up with short interval follow-up MRI in 2-3 months to document stability recommended. 3. Otherwise negative brain MRI for age. No other acute intracranial abnormality. MRI CERVICAL SPINE IMPRESSION: 1. Normal MRI appearance of the cervical spinal cord. No evidence for demyelinating disease. 2. 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 Associated mild-to-moderate right C4, C5, C6, and C8 foraminal stenosis, with moderate left C6 foraminal narrowing. Electronically Signed   By: Rise Mu M.D.   On: 03/09/2021 23:48   DG Chest Portable 1 View  Result Date: 03/09/2021 CLINICAL DATA:  Shortness of breath.  Pharyngitis. EXAM: PORTABLE CHEST 1 VIEW COMPARISON:  None. FINDINGS: The heart size and mediastinal contours are within normal limits. Both lungs are clear. The visualized skeletal structures are unremarkable. IMPRESSION: No active disease. Electronically Signed   By: Beckie Salts M.D.   On: 03/09/2021 14:43   ECHOCARDIOGRAM COMPLETE  Result Date: 03/10/2021    ECHOCARDIOGRAM REPORT   Patient Name:   [facility] Date of Exam: 03/10/2021 Medical Rec #:  [id number]      Height:       63.0 in Accession #:    [id number]     Weight:       165.6 lb Date of Birth:  Jun 21, 1961      BSA:          1.784 m Patient Age:    59 years       BP:           183/97 mmHg Patient  Gender: F              HR:           74 bpm. Exam Location:  Inpatient Procedure: 2D Echo, Cardiac Doppler and Color Doppler Indications:    Stroke l63.9  History:        Patient has no prior history of Echocardiogram examinations.                 Risk Factors:Hypertension.  Sonographer:    Celesta Gentile RCS Referring Phys: [phone] Rosina Cressler IMPRESSIONS  1. Left ventricular ejection fraction, by estimation, is 60 to 65%. The left ventricle has normal function. The left ventricle has no regional wall motion abnormalities. Left ventricular diastolic parameters are consistent with Grade I diastolic  dysfunction (impaired relaxation).  2. Right ventricular systolic function is normal. The right ventricular size is normal.  3. The mitral valve is normal in structure. Trivial mitral valve regurgitation. No evidence of mitral stenosis.  4. The aortic valve is normal in structure. Aortic valve regurgitation is not visualized. No aortic stenosis is present. FINDINGS  Left Ventricle: Left ventricular ejection fraction, by estimation, is 60 to 65%. The left ventricle has normal function. The left ventricle has no regional wall motion abnormalities. The left ventricular internal cavity size was normal in size. There is  no left ventricular hypertrophy. Left ventricular diastolic parameters are consistent with Grade I diastolic dysfunction (impaired relaxation). Right Ventricle: The right ventricular size is normal. No increase in right ventricular wall thickness. Right ventricular systolic function is normal. Left Atrium: Left atrial size was normal in size. Right Atrium: Right atrial size was normal in size. Pericardium: There is no evidence of pericardial effusion. Mitral Valve: The mitral valve is normal in structure. Trivial mitral valve regurgitation. No evidence of mitral valve stenosis. Tricuspid Valve: The tricuspid valve is normal in structure. Tricuspid valve regurgitation is trivial. Aortic Valve: The aortic valve is normal in structure. Aortic valve regurgitation is not visualized. No aortic stenosis is present. Pulmonic Valve: The pulmonic valve was normal in structure. Pulmonic valve regurgitation is not visualized. Aorta: The aortic root and ascending aorta are structurally normal, with no evidence of dilitation. IAS/Shunts: The atrial septum is grossly normal.  LEFT VENTRICLE PLAX 2D LVIDd:         5.40 cm  Diastology LVIDs:         3.00 cm  LV e' medial:    4.24 cm/s LV PW:         1.00 cm  LV E/e' medial:  11.4 LV IVS:        1.00 cm  LV e' lateral:   6.31 cm/s LVOT diam:     1.80 cm  LV E/e' lateral:  7.7 LV SV:         54 LV SV Index:   30 LVOT Area:     2.54 cm  RIGHT VENTRICLE RV S prime:     14.50 cm/s TAPSE (M-mode): 2.0 cm LEFT ATRIUM           Index       RIGHT ATRIUM           Index LA diam:      3.10 cm 1.74 cm/m  RA Area:     11.50 cm LA Vol (A2C): 34.2 ml 19.17 ml/m RA Volume:   25.10 ml  14.07 ml/m LA Vol (A4C): 49.3 ml 27.63 ml/m  AORTIC VALVE LVOT Vmax:   90.90 cm/s LVOT Vmean:  59.400 cm/s LVOT VTI:    0.211 m  AORTA Ao Root diam: 3.10 cm MITRAL VALVE MV Area (PHT): 4.89 cm    SHUNTS MV Decel Time: 155 msec    Systemic VTI:  0.21 m MV E velocity: 48.30 cm/s  Systemic Diam: 1.80 cm MV A velocity: 74.70 cm/s MV E/A ratio:  0.65 Kristeen Miss MD Electronically signed by Kristeen Miss MD Signature Date/Time: 03/10/2021/12:40:22 PM    Final    US THYROID  Result Date: 03/11/2021 CLINICAL DATA:  Nodule on CT neck EXAM: THYROID ULTRASOUND TECHNIQUE: Ultrasound examination of the thyroid gland and adjacent soft tissues was performed. COMPARISON:  CT 03/09/2021 FINDINGS: Parenchymal Echotexture: Normal Isthmus: 0.2 cm thickness Right lobe: 4.5 x 1.8 x 1.5 cm Left lobe: 3.7 x 1.3 x 1.4 cm _________________________________________________________ Estimated total number of nodules >/= 1 cm: 0 Number of spongiform nodules >/=  2 cm not described below (TR1): 0 Number of mixed cystic and solid nodules >/= 1.5 cm not described below (TR2): 0 _________________________________________________________ Nodule # 1: 0.8 cm isoechoic nodule without calcifications, inferior left; This nodule does NOT meet TI-RADS criteria for biopsy or dedicated follow-up. Nodule # 2: 0.7 cm hypoechoic nodule without calcifications, inferior left; This nodule does NOT meet TI-RADS criteria for biopsy or dedicated follow-up. No regional cervical adenopathy identified. Limited images of submandibular glands unremarkable. IMPRESSION: 1. Normal-sized thyroid with small left nodules, which do not meet criteria for biopsy or  follow-up. The above is in keeping with the ACR TI-RADS recommendations - J Am Coll Radiol 2017;14:587-595. Electronically Signed   By: Corlis Leak M.D.   On: 03/11/2021 07:42   VAS US CAROTID  Result Date: 03/11/2021 Carotid Arterial Duplex Study Patient Name:  [facility]  Date of Exam:   03/10/2021 Medical Rec #: [id number]       Accession #:    [id number] Date of Birth: 21-Jan-1961       Patient Gender: F Patient Age:   64Y Exam Location:  [facility] Procedure:      VAS US CAROTID Referring Phys: [id number] JARED M GARDNER --------------------------------------------------------------------------------  Indications:       Weakness and Left sided weakness and hoarse voice. Risk Factors:      Hypertension. Comparison Study:  No prior study Performing Technologist: Sherren Kerns RVS  Examination Guidelines: A complete evaluation includes B-mode imaging, spectral Doppler, color Doppler, and power Doppler as needed of all accessible portions of each vessel. Bilateral testing is considered an integral part of a complete examination. Limited examinations for reoccurring indications may be performed as noted.  Right Carotid Findings: +----------+--------+--------+--------+------------------+------------------+           PSV cm/sEDV cm/sStenosisPlaque DescriptionComments           +----------+--------+--------+--------+------------------+------------------+ CCA Prox  94      26                                intimal thickening +----------+--------+--------+--------+------------------+------------------+ CCA Distal104     27                                intimal thickening +----------+--------+--------+--------+------------------+------------------+ ICA Prox  48      16                                                   +----------+--------+--------+--------+------------------+------------------+  ICA Distal91      34                                                    +----------+--------+--------+--------+------------------+------------------+ ECA       104     16                                                   +----------+--------+--------+--------+------------------+------------------+ +----------+--------+-------+--------+-------------------+           PSV cm/sEDV cmsDescribeArm Pressure (mmHG) +----------+--------+-------+--------+-------------------+ [id number]                                         +----------+--------+-------+--------+-------------------+ +---------+--------+--+--------+--+ VertebralPSV cm/s87EDV cm/s24 +---------+--------+--+--------+--+  Left Carotid Findings: +----------+--------+--------+--------+------------------+------------------+           PSV cm/sEDV cm/sStenosisPlaque DescriptionComments           +----------+--------+--------+--------+------------------+------------------+ CCA Prox  99      26                                intimal thickening +----------+--------+--------+--------+------------------+------------------+ CCA Distal81      32                                intimal thickening +----------+--------+--------+--------+------------------+------------------+ ICA Prox  76      26                                                   +----------+--------+--------+--------+------------------+------------------+ ICA Distal94      36                                                   +----------+--------+--------+--------+------------------+------------------+ ECA       128     28                                                   +----------+--------+--------+--------+------------------+------------------+ +----------+--------+--------+--------+-------------------+           PSV cm/sEDV cm/sDescribeArm Pressure (mmHG) +----------+--------+--------+--------+-------------------+ [id number]                                           +----------+--------+--------+--------+-------------------+ +---------+--------+--+--------+--+ VertebralPSV cm/s78EDV cm/s24 +---------+--------+--+--------+--+   Summary: Right Carotid: The extracranial vessels were near-normal with only minimal wall                thickening or plaque. Left Carotid: The extracranial vessels were near-normal with only minimal wall  thickening or plaque. Vertebrals:  Bilateral vertebral arteries demonstrate antegrade flow. Subclavians: Normal flow hemodynamics were seen in bilateral subclavian              arteries. *See table(s) above for measurements and observations.  Electronically signed by Sherald Hess MD on 03/11/2021 at 10:23:49 AM.    Final       Scheduled Meds:   stroke: mapping our early stages of recovery book   Does not apply Once   amLODipine  10 mg Oral Daily   aspirin EC  81 mg Oral Daily   diclofenac Sodium  2 g Topical QID   lisinopril  20 mg Oral Daily   Continuous Infusions:  sodium chloride 125 mL/hr at 03/11/21 1046     LOS: 1 day      Calvert Cantor, MD [facility] Pager: [email or web address] 03/11/2021, 2:37 PM

## 2021-03-11 NOTE — Progress Notes (Signed)
Pt has c/o pain/irritation @ site of RAC PIV. Upon inspection IV is leaking small amounts of serosanguinous fluid, otherwise, no other inflammation or swelling noted at site. IV still has good flush and blood return. MD paged and notified that pt has had adequate PO fluid intake throughout day and is requesting IV fluids be discontinued. MD placed orders to discontinue maintenance fluids.

## 2021-03-11 NOTE — Progress Notes (Signed)
Thyroid ultrasound reveals 2 very small nodules on the left.  Neither 1 meets criteria for biopsy.  No need for additional work-up of the thyroid.  Her voice is slightly stronger today.  She will follow-up with a local otolaryngologist when she returns to [place].

## 2021-03-11 NOTE — Progress Notes (Signed)
STROKE TEAM PROGRESS NOTE   SUBJECTIVE (INTERVAL HISTORY) Her husband and daughter are at the bedside.  Patient continues to have low voice, whisper.  ENT consulted yesterday, had thyroid ultrasound but no significant finding.  CT chest negative.  Infectious disease also involved, recommending serology testing for infection.  ESR and CRP negative.  Discussed with patient and she agreed with LP tomorrow.  If negative for infection, may consider short-term steroids trial.   OBJECTIVE Temp:  [97.9 F (36.6 C)-98.5 F (36.9 C)] 98.5 F (36.9 C) (07/11 1547) Pulse Rate:  [74-88] 74 (07/11 1547) Cardiac Rhythm: Normal sinus rhythm (07/11 0700) Resp:  [14-18] 14 (07/11 1547) BP: (130-189)/(83-117) 154/90 (07/11 1547) SpO2:  [95 %-98 %] 95 % (07/11 1547)  No results for input(s): GLUCAP in the last 168 hours. Recent Labs  Lab 03/09/21 1431  NA 141  K 3.8  CL 105  CO2 27  GLUCOSE 94  BUN 15  CREATININE 0.72  CALCIUM 9.6   Recent Labs  Lab 03/09/21 1431  AST 17  ALT 17  ALKPHOS 72  BILITOT 0.7  PROT 7.4  ALBUMIN 4.6   Recent Labs  Lab 03/09/21 1431  WBC 7.3  NEUTROABS 4.5  HGB 14.0  HCT 41.0  MCV 87.4  PLT 246   No results for input(s): CKTOTAL, CKMB, CKMBINDEX, TROPONINI in the last 168 hours. No results for input(s): LABPROT, INR in the last 72 hours. No results for input(s): COLORURINE, LABSPEC, [place], GLUCOSEU, HGBUR, BILIRUBINUR, KETONESUR, PROTEINUR, UROBILINOGEN, NITRITE, LEUKOCYTESUR in the last 72 hours.  Invalid input(s): APPERANCEUR     Component Value Date/Time   CHOL 209 (H) 03/10/2021 0402   TRIG 90 03/10/2021 0402   HDL 68 03/10/2021 0402   CHOLHDL 3.1 03/10/2021 0402   VLDL 18 03/10/2021 0402   LDLCALC 123 (H) 03/10/2021 0402   Lab Results  Component Value Date   HGBA1C 5.5 03/10/2021      Component Value Date/Time   LABOPIA NONE DETECTED 03/11/2021 1430   COCAINSCRNUR NONE DETECTED 03/11/2021 1430   LABBENZ POSITIVE (A) 03/11/2021 1430    AMPHETMU NONE DETECTED 03/11/2021 1430   THCU NONE DETECTED 03/11/2021 1430   LABBARB NONE DETECTED 03/11/2021 1430    No results for input(s): ETH in the last 168 hours.  I have personally reviewed the radiological images below and agree with the radiology interpretations.  CT Head Wo Contrast  Result Date: 03/09/2021 CLINICAL DATA:  Neuro deficit, acute, stroke suspected EXAM: CT HEAD WITHOUT CONTRAST TECHNIQUE: Contiguous axial images were obtained from the base of the skull through the vertex without intravenous contrast. COMPARISON:  None. FINDINGS: Brain: No acute intracranial abnormality. Specifically, no hemorrhage, hydrocephalus, mass lesion, acute infarction, or significant intracranial injury. Vascular: No hyperdense vessel or unexpected calcification. Skull: No acute calvarial abnormality. Sinuses/Orbits: No acute findings Other: None IMPRESSION: No acute intracranial abnormality. Electronically Signed   By: Rolm Baptise M.D.   On: 03/09/2021 16:44   CT Soft Tissue Neck W Contrast  Result Date: 03/09/2021 CLINICAL DATA:  Neck abscess, deep tissue. EXAM: CT NECK WITH CONTRAST TECHNIQUE: Multidetector CT imaging of the neck was performed using the standard protocol following the bolus administration of intravenous contrast. CONTRAST:  59m OMNIPAQUE IOHEXOL 300 MG/ML  SOLN COMPARISON:  None. FINDINGS: Pharynx and larynx: Mildly enlarged and heterogeneous palatine tonsils, suggestive of tonsillitis. Salivary glands: No inflammation, mass, or stone. Thyroid: Approximately 8 mm left thyroid nodule, which is not require further imaging follow-up per current guidelines. Lymph nodes:  None enlarged or abnormal density. Vascular: Limited evaluation due to non arterial timing with major arteries in the neck appearing grossly patent. Limited intracranial: Negative. Visualized orbits: Negative. Mastoids and visualized paranasal sinuses: Clear. 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 Upper chest: Visualized lung apices are clear. IMPRESSION: 1. Mildly enlarged and heterogeneous palatine tonsils, which may reflect tonsillitis. Recommend correlation with direct inspection. No discrete drainable fluid collection. 2. 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 No evidence of a mass along the expected course of the recurrent laryngeal in the neck; however, the AP window is incompletely imaged on this study. Recommend non urgent follow-up chest CT with contrast to fully characterize. 3. 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 Electronically Signed   By: Margaretha Sheffield MD   On: 03/09/2021 17:17   CT CHEST W CONTRAST  Result Date: 03/10/2021 CLINICAL DATA:  Upper respiratory illness. Evaluate recurrent laryngeal nerve. Abnormal left vocal cord on neck CT. EXAM: CT CHEST WITH CONTRAST TECHNIQUE: Multidetector CT imaging of the chest was performed during intravenous contrast administration. CONTRAST:  38m OMNIPAQUE IOHEXOL 300 MG/ML  SOLN COMPARISON:  Neck CT of 1 day prior. Chest radiograph of 1 day prior. FINDINGS: Cardiovascular: Normal aortic caliber. Tortuous thoracic aorta. Borderline cardiomegaly. Mediastinum/Nodes: 7 mm left thyroid nodule Not clinically significant; no follow-up imaging recommended (ref: J Am Coll Radiol. 2015 Feb;12(2): 143-50). No mediastinal or hilar adenopathy.  No mass in the AP window. Esophageal fluid level including on 65/3. Lungs/Pleura: No pleural fluid. Mild left base scarring. No lung mass. Upper Abdomen: Gallbladder stones versus vicarious excretion of contrast is evidenced by dependent hyperattenuation. Suspect at least 1 too small to characterize right hepatic lobe lesion inferiorly. Normal imaged portions of the spleen, stomach, pancreas, adrenal glands, kidneys. Musculoskeletal: No acute osseous abnormality.  IMPRESSION: 1. No thoracic mass to explain laryngeal paralysis. 2.  No acute process in the chest. 3. Esophageal air fluid level suggests dysmotility or gastroesophageal reflux. 4. Cholelithiasis versus vicarious excretion of contrast. Electronically Signed   By: KAbigail MiyamotoM.D.   On: 03/10/2021 18:49   MR ANGIO HEAD WO CONTRAST  Result Date: 03/10/2021 CLINICAL DATA:  Neuro deficit with stroke suspected EXAM: MRA HEAD WITHOUT CONTRAST TECHNIQUE: Angiographic images of the Circle of Willis were acquired using MRA technique without intravenous contrast. COMPARISON:  No pertinent prior exam. FINDINGS: Anterior circulation: Vessels are smooth and widely patent. Negative for aneurysm Posterior circulation: Vessels are smooth and widely patent. Negative for aneurysm Anatomic variants: None significant IMPRESSION: Normal MRA. Electronically Signed   By: JMonte FantasiaM.D.   On: 03/10/2021 07:33   MR Brain W and Wo Contrast  Result Date: 03/09/2021 CLINICAL DATA:  Initial evaluation for possible stroke versus demyelinating disease. EXAM: MRI HEAD WITHOUT AND WITH CONTRAST MRI CERVICAL SPINE WITHOUT AND WITH CONTRAST TECHNIQUE: Multiplanar, multiecho pulse sequences of the brain and surrounding structures, and cervical spine, to include the craniocervical junction and cervicothoracic junction, were obtained without and with intravenous contrast. CONTRAST:  758mGADAVIST GADOBUTROL 1 MMOL/ML IV SOLN COMPARISON:  Prior CT from earlier the same day. FINDINGS: MRI HEAD FINDINGS Brain: Cerebral volume within normal limits for age. Patchy and hazy T2/FLAIR hyperintensity seen involving the periventricular and deep white matter of both cerebral hemispheres, nonspecific, but overall mild to moderate in nature. Overall, changes are most pronounced within the right frontal lobe (series 11, image 18). Few corresponding  areas of T1 hypointensity noted about the basal ganglia (series 16, images 28, 31), favored to reflect  small remote lacunar infarcts. No infratentorial signal abnormality. No abnormal enhancement to suggest acute or subacute ischemia or active demyelination. Gray-white matter differentiation maintained. No encephalomalacia to suggest chronic cortical infarction. No foci of susceptibility artifact to suggest acute or chronic intracranial hemorrhage. No definite mass lesion. No mass effect or midline shift. Ventricles normal in size without hydrocephalus. Cavum et septum pellucidum noted. No extra-axial fluid collection. Pituitary gland and suprasellar region within normal limits. Midline structures intact and normal. Prominent calcification noted along the anterior falx. There is an apparent punctate 3 mm focus of enhancement involving the inferior left basal ganglia (series 18, image 21). Finding of uncertain etiology or significance. No other abnormal enhancement. Vascular: Major intracranial vascular flow voids are maintained. Skull and upper cervical spine: Craniocervical junction within normal limits. Bone marrow signal intensity normal. No scalp soft tissue abnormality. Sinuses/Orbits: Globes and orbital soft tissues within normal limits. Paranasal sinuses are largely clear. No mastoid effusion. Inner ear structures grossly normal. Other: None. MRI CERVICAL SPINE FINDINGS Alignment: Straightening with mild reversal of the normal cervical lordosis. Trace anterolisthesis of C4 on C5, with trace retrolisthesis of C5 on C6. 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 Vertebral body height maintained without acute or chronic fracture. Bone marrow signal intensity within normal limits. No discrete or worrisome osseous lesions. No abnormal marrow edema or enhancement. Cord: Signal intensity within the cervical spinal cord is normal. Normal cord signal changes to suggest demyelinating disease. Normal cord caliber morphology. No abnormal enhancement. Posterior  Fossa, vertebral arteries, paraspinal tissues: Craniocervical junction within normal limits. Paraspinous and prevertebral soft tissues within normal limits. Normal flow voids seen within the vertebral arteries bilaterally. Disc levels: C2-C3: Unremarkable. C3-C4: Mild right greater than left uncovertebral hypertrophy without significant disc bulge. Mild right-sided facet degeneration. No spinal stenosis. Mild right C4 foraminal narrowing. Left neural foramina remains patent. C4-C5: Mild disc bulge with uncovertebral and endplate spurring. Flattening of the ventral thecal sac without significant spinal stenosis or cord deformity. Mild right C5 foraminal stenosis. Left neural foramina remains patent. C5-C6: Degenerative intervertebral disc space narrowing with diffuse disc osteophyte complex. Broad posterior component flattens and partially faces the ventral thecal sac with mild spinal stenosis. No more than minimal flattening of the ventral cord. Moderate left with mild right C6 foraminal narrowing. C6-C7: Congenital fusion of C6 and C7. No canal or foraminal stenosis. C7-T1: Mild disc bulge with right greater than left uncovertebral hypertrophy. Bilateral facet degeneration. No spinal stenosis. Mild to moderate right C8 foraminal narrowing. Left neural foramina remains patent. Visualized upper thoracic spine demonstrates no significant finding. IMPRESSION: MRI HEAD IMPRESSION: 1. Patchy and hazy T2/FLAIR hyperintensity involving the supratentorial cerebral white matter as above, nonspecific, but overall mild to moderate in nature. 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 Possible demyelinating disease is not entirely excluded, although appearance would be atypical for this process. No convincing changes of active demyelination. 2. Punctate 3 mm focus of enhancement involving the inferior left basal ganglia. Finding is of uncertain etiology or significance,  and could reflect a small vascular structure. Outpatient neurology follow-up with short interval follow-up MRI in 2-3 months to document stability recommended. 3. Otherwise negative brain MRI for age. No other acute intracranial abnormality. MRI CERVICAL SPINE IMPRESSION: 1. Normal MRI appearance of the cervical spinal cord. No  evidence for demyelinating disease. 2. 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 Associated mild-to-moderate right C4, C5, C6, and C8 foraminal stenosis, with moderate left C6 foraminal narrowing. Electronically Signed   By: Jeannine Boga M.D.   On: 03/09/2021 23:48   MR Cervical Spine W or Wo Contrast  Result Date: 03/09/2021 CLINICAL DATA:  Initial evaluation for possible stroke versus demyelinating disease. EXAM: MRI HEAD WITHOUT AND WITH CONTRAST MRI CERVICAL SPINE WITHOUT AND WITH CONTRAST TECHNIQUE: Multiplanar, multiecho pulse sequences of the brain and surrounding structures, and cervical spine, to include the craniocervical junction and cervicothoracic junction, were obtained without and with intravenous contrast. CONTRAST:  67m GADAVIST GADOBUTROL 1 MMOL/ML IV SOLN COMPARISON:  Prior CT from earlier the same day. FINDINGS: MRI HEAD FINDINGS Brain: Cerebral volume within normal limits for age. Patchy and hazy T2/FLAIR hyperintensity seen involving the periventricular and deep white matter of both cerebral hemispheres, nonspecific, but overall mild to moderate in nature. Overall, changes are most pronounced within the right frontal lobe (series 11, image 18). Few corresponding areas of T1 hypointensity noted about the basal ganglia (series 16, images 28, 31), favored to reflect small remote lacunar infarcts. No infratentorial signal abnormality. No abnormal enhancement to suggest acute or subacute ischemia or active demyelination. Gray-white matter differentiation maintained. No encephalomalacia to suggest chronic cortical infarction.  No foci of susceptibility artifact to suggest acute or chronic intracranial hemorrhage. No definite mass lesion. No mass effect or midline shift. Ventricles normal in size without hydrocephalus. Cavum et septum pellucidum noted. No extra-axial fluid collection. Pituitary gland and suprasellar region within normal limits. Midline structures intact and normal. Prominent calcification noted along the anterior falx. There is an apparent punctate 3 mm focus of enhancement involving the inferior left basal ganglia (series 18, image 21). Finding of uncertain etiology or significance. No other abnormal enhancement. Vascular: Major intracranial vascular flow voids are maintained. Skull and upper cervical spine: Craniocervical junction within normal limits. Bone marrow signal intensity normal. No scalp soft tissue abnormality. Sinuses/Orbits: Globes and orbital soft tissues within normal limits. Paranasal sinuses are largely clear. No mastoid effusion. Inner ear structures grossly normal. Other: None. MRI CERVICAL SPINE FINDINGS Alignment: Straightening with mild reversal of the normal cervical lordosis. Trace anterolisthesis of C4 on C5, with trace retrolisthesis of C5 on C6. 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 Vertebral body height maintained without acute or chronic fracture. Bone marrow signal intensity within normal limits. No discrete or worrisome osseous lesions. No abnormal marrow edema or enhancement. Cord: Signal intensity within the cervical spinal cord is normal. Normal cord signal changes to suggest demyelinating disease. Normal cord caliber morphology. No abnormal enhancement. Posterior Fossa, vertebral arteries, paraspinal tissues: Craniocervical junction within normal limits. Paraspinous and prevertebral soft tissues within normal limits. Normal flow voids seen within the vertebral arteries bilaterally. Disc levels: C2-C3: Unremarkable.  C3-C4: Mild right greater than left uncovertebral hypertrophy without significant disc bulge. Mild right-sided facet degeneration. No spinal stenosis. Mild right C4 foraminal narrowing. Left neural foramina remains patent. C4-C5: Mild disc bulge with uncovertebral and endplate spurring. Flattening of the ventral thecal sac without significant spinal stenosis or cord deformity. Mild right C5 foraminal stenosis. Left neural foramina remains patent. C5-C6: Degenerative intervertebral disc space narrowing with diffuse disc osteophyte complex. Broad posterior component flattens and partially faces the ventral thecal sac with mild spinal stenosis. No more than minimal flattening of the ventral cord. Moderate left with  mild right C6 foraminal narrowing. C6-C7: Congenital fusion of C6 and C7. No canal or foraminal stenosis. C7-T1: Mild disc bulge with right greater than left uncovertebral hypertrophy. Bilateral facet degeneration. No spinal stenosis. Mild to moderate right C8 foraminal narrowing. Left neural foramina remains patent. Visualized upper thoracic spine demonstrates no significant finding. IMPRESSION: MRI HEAD IMPRESSION: 1. Patchy and hazy T2/FLAIR hyperintensity involving the supratentorial cerebral white matter as above, nonspecific, but overall mild to moderate in nature. 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 Possible demyelinating disease is not entirely excluded, although appearance would be atypical for this process. No convincing changes of active demyelination. 2. Punctate 3 mm focus of enhancement involving the inferior left basal ganglia. Finding is of uncertain etiology or significance, and could reflect a small vascular structure. Outpatient neurology follow-up with short interval follow-up MRI in 2-3 months to document stability recommended. 3. Otherwise negative brain MRI for age. No other acute intracranial abnormality. MRI CERVICAL  SPINE IMPRESSION: 1. Normal MRI appearance of the cervical spinal cord. No evidence for demyelinating disease. 2. 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 Associated mild-to-moderate right C4, C5, C6, and C8 foraminal stenosis, with moderate left C6 foraminal narrowing. Electronically Signed   By: Jeannine Boga M.D.   On: 03/09/2021 23:48   DG Chest Portable 1 View  Result Date: 03/09/2021 CLINICAL DATA:  Shortness of breath.  Pharyngitis. EXAM: PORTABLE CHEST 1 VIEW COMPARISON:  None. FINDINGS: The heart size and mediastinal contours are within normal limits. Both lungs are clear. The visualized skeletal structures are unremarkable. IMPRESSION: No active disease. Electronically Signed   By: Claudie Revering M.D.   On: 03/09/2021 14:43   ECHOCARDIOGRAM COMPLETE  Result Date: 03/10/2021    ECHOCARDIOGRAM REPORT   Patient Name:   [facility] Date of Exam: 03/10/2021 Medical Rec #:  [id number]      Height:       63.0 in Accession #:    [id number]     Weight:       165.6 lb Date of Birth:  December 27, 1960      BSA:          1.784 m Patient Age:    60 years       BP:           183/97 mmHg Patient Gender: F              HR:           74 bpm. Exam Location:  Inpatient Procedure: 2D Echo, Cardiac Doppler and Color Doppler Indications:    Stroke l63.9  History:        Patient has no prior history of Echocardiogram examinations.                 Risk Factors:Hypertension.  Sonographer:    Alvino Chapel RCS Referring Phys: [phone] [place]  1. Left ventricular ejection fraction, by estimation, is 60 to 65%. The left ventricle has normal function. The left ventricle has no regional wall motion abnormalities. Left ventricular diastolic parameters are consistent with Grade I diastolic dysfunction (impaired relaxation).  2. Right ventricular systolic function is normal. The right ventricular size is normal.  3. The mitral valve is normal in structure. Trivial mitral valve  regurgitation. No evidence of mitral stenosis.  4. The aortic valve is normal in structure. Aortic valve regurgitation is not visualized. No aortic stenosis is present. FINDINGS  Left Ventricle: Left ventricular  ejection fraction, by estimation, is 60 to 65%. The left ventricle has normal function. The left ventricle has no regional wall motion abnormalities. The left ventricular internal cavity size was normal in size. There is  no left ventricular hypertrophy. Left ventricular diastolic parameters are consistent with Grade I diastolic dysfunction (impaired relaxation). Right Ventricle: The right ventricular size is normal. No increase in right ventricular wall thickness. Right ventricular systolic function is normal. Left Atrium: Left atrial size was normal in size. Right Atrium: Right atrial size was normal in size. Pericardium: There is no evidence of pericardial effusion. Mitral Valve: The mitral valve is normal in structure. Trivial mitral valve regurgitation. No evidence of mitral valve stenosis. Tricuspid Valve: The tricuspid valve is normal in structure. Tricuspid valve regurgitation is trivial. Aortic Valve: The aortic valve is normal in structure. Aortic valve regurgitation is not visualized. No aortic stenosis is present. Pulmonic Valve: The pulmonic valve was normal in structure. Pulmonic valve regurgitation is not visualized. Aorta: The aortic root and ascending aorta are structurally normal, with no evidence of dilitation. IAS/Shunts: The atrial septum is grossly normal.  LEFT VENTRICLE PLAX 2D LVIDd:         5.40 cm  Diastology LVIDs:         3.00 cm  LV e' medial:    4.24 cm/s LV PW:         1.00 cm  LV E/e' medial:  11.4 LV IVS:        1.00 cm  LV e' lateral:   6.31 cm/s LVOT diam:     1.80 cm  LV E/e' lateral: 7.7 LV SV:         54 LV SV Index:   30 LVOT Area:     2.54 cm  RIGHT VENTRICLE RV S prime:     14.50 cm/s TAPSE (M-mode): 2.0 cm LEFT ATRIUM           Index       RIGHT ATRIUM            Index LA diam:      3.10 cm 1.74 cm/m  RA Area:     11.50 cm LA Vol (A2C): 34.2 ml 19.17 ml/m RA Volume:   25.10 ml  14.07 ml/m LA Vol (A4C): 49.3 ml 27.63 ml/m  AORTIC VALVE LVOT Vmax:   90.90 cm/s LVOT Vmean:  59.400 cm/s LVOT VTI:    0.211 m  AORTA Ao Root diam: 3.10 cm MITRAL VALVE MV Area (PHT): 4.89 cm    SHUNTS MV Decel Time: 155 msec    Systemic VTI:  0.21 m MV E velocity: 48.30 cm/s  Systemic Diam: 1.80 cm MV A velocity: 74.70 cm/s MV E/A ratio:  0.65 Mertie Moores MD Electronically signed by Mertie Moores MD Signature Date/Time: 03/10/2021/12:40:22 PM    Final    US THYROID  Result Date: 03/11/2021 CLINICAL DATA:  Nodule on CT neck EXAM: THYROID ULTRASOUND TECHNIQUE: Ultrasound examination of the thyroid gland and adjacent soft tissues was performed. COMPARISON:  CT 03/09/2021 FINDINGS: Parenchymal Echotexture: Normal Isthmus: 0.2 cm thickness Right lobe: 4.5 x 1.8 x 1.5 cm Left lobe: 3.7 x 1.3 x 1.4 cm _________________________________________________________ Estimated total number of nodules >/= 1 cm: 0 Number of spongiform nodules >/=  2 cm not described below (TR1): 0 Number of mixed cystic and solid nodules >/= 1.5 cm not described below ([place]): 0 _________________________________________________________ Nodule # 1: 0.8 cm isoechoic nodule without calcifications, inferior left; This nodule does NOT meet TI-RADS  criteria for biopsy or dedicated follow-up. Nodule # 2: 0.7 cm hypoechoic nodule without calcifications, inferior left; This nodule does NOT meet TI-RADS criteria for biopsy or dedicated follow-up. No regional cervical adenopathy identified. Limited images of submandibular glands unremarkable. IMPRESSION: 1. Normal-sized thyroid with small left nodules, which do not meet criteria for biopsy or follow-up. The above is in keeping with the ACR TI-RADS recommendations - J Am Coll Radiol 2017;14:587-595. Electronically Signed   By: Lucrezia Europe M.D.   On: 03/11/2021 07:42   VAS US  CAROTID  Result Date: 03/11/2021 Carotid Arterial Duplex Study Patient Name:  [facility]  Date of Exam:   03/10/2021 Medical Rec #: [id number]       Accession #:    [id number] Date of Birth: [birth year]       Patient Gender: F Patient Age:   68Y Exam Location:  [facility] Procedure:      VAS US CAROTID Referring Phys: [id number] JARED M GARDNER --------------------------------------------------------------------------------  Indications:       Weakness and Left sided weakness and hoarse voice. Risk Factors:      Hypertension. Comparison Study:  No prior study Performing Technologist: Sharion Dove RVS  Examination Guidelines: A complete evaluation includes B-mode imaging, spectral Doppler, color Doppler, and power Doppler as needed of all accessible portions of each vessel. Bilateral testing is considered an integral part of a complete examination. Limited examinations for reoccurring indications may be performed as noted.  Right Carotid Findings: +----------+--------+--------+--------+------------------+------------------+           PSV cm/sEDV cm/sStenosisPlaque DescriptionComments           +----------+--------+--------+--------+------------------+------------------+ CCA Prox  94      26                                intimal thickening +----------+--------+--------+--------+------------------+------------------+ CCA Distal104     27                                intimal thickening +----------+--------+--------+--------+------------------+------------------+ ICA Prox  48      16                                                   +----------+--------+--------+--------+------------------+------------------+ ICA Distal91      34                                                   +----------+--------+--------+--------+------------------+------------------+ ECA       104     16                                                    +----------+--------+--------+--------+------------------+------------------+ +----------+--------+-------+--------+-------------------+           PSV cm/sEDV cmsDescribeArm Pressure (mmHG) +----------+--------+-------+--------+-------------------+ [id number]                                         +----------+--------+-------+--------+-------------------+ +---------+--------+--+--------+--+  VertebralPSV cm/s87EDV cm/s24 +---------+--------+--+--------+--+  Left Carotid Findings: +----------+--------+--------+--------+------------------+------------------+           PSV cm/sEDV cm/sStenosisPlaque DescriptionComments           +----------+--------+--------+--------+------------------+------------------+ CCA Prox  99      26                                intimal thickening +----------+--------+--------+--------+------------------+------------------+ CCA Distal81      32                                intimal thickening +----------+--------+--------+--------+------------------+------------------+ ICA Prox  76      26                                                   +----------+--------+--------+--------+------------------+------------------+ ICA Distal94      36                                                   +----------+--------+--------+--------+------------------+------------------+ ECA       128     28                                                   +----------+--------+--------+--------+------------------+------------------+ +----------+--------+--------+--------+-------------------+           PSV cm/sEDV cm/sDescribeArm Pressure (mmHG) +----------+--------+--------+--------+-------------------+ [id number]                                          +----------+--------+--------+--------+-------------------+ +---------+--------+--+--------+--+ VertebralPSV cm/s78EDV cm/s24 +---------+--------+--+--------+--+   Summary: Right  Carotid: The extracranial vessels were near-normal with only minimal wall                thickening or plaque. Left Carotid: The extracranial vessels were near-normal with only minimal wall               thickening or plaque. Vertebrals:  Bilateral vertebral arteries demonstrate antegrade flow. Subclavians: Normal flow hemodynamics were seen in bilateral subclavian              arteries. *See table(s) above for measurements and observations.  Electronically signed by Monica Martinez MD on 03/11/2021 at 10:23:49 AM.    Final       PHYSICAL EXAM  Temp:  [97.9 F (36.6 C)-98.5 F (36.9 C)] 98.5 F (36.9 C) (07/11 1547) Pulse Rate:  [74-88] 74 (07/11 1547) Resp:  [14-18] 14 (07/11 1547) BP: (130-189)/(83-117) 154/90 (07/11 1547) SpO2:  [95 %-98 %] 95 % (07/11 1547)  General - Well nourished, well developed, in no apparent distress.  Ophthalmologic - fundi not visualized due to noncooperation.  Cardiovascular - Regular rhythm and rate.  Neck - tenderness and fullness left neck below left mastoid. Mild left occipital nerve tenderness on palpation   Mental Status -  Level of arousal and orientation to time, place, and person were intact. Language including  expression, naming, repetition, comprehension was assessed and found intact. Attention span and concentration were normal. Fund of Knowledge was assessed and was intact.  Cranial Nerves II - XII - II - Visual field intact OU. III, IV, VI - Extraocular movements intact. V - Facial sensation intact bilaterally. VII - Facial movement intact bilaterally. VIII - Hearing & vestibular intact bilaterally. Left ear solitary reddish rash, itchy per pt X - Palate elevates symmetrically. Whisper voice on talking XI - Chin turning & shoulder shrug intact bilaterally. XII - Tongue protrusion intact.  Motor Strength - The patient's strength was normal in all extremities except left deltoid 4/5.  Bulk was normal and fasciculations were absent.    Motor Tone - Muscle tone was assessed at the neck and appendages and was normal.  Reflexes - The patient's reflexes were symmetrical in all extremities and she had no pathological reflexes.  Sensory - Light touch, temperature/pinprick were assessed and were symmetrical.    Coordination - The patient had normal movements in the hands and feet with no ataxia or dysmetria.  Tremor was absent.  Gait and Station - deferred.   ASSESSMENT/PLAN Ms. Alexis Kerr is a 60 y.o. female with history of HTN admitted for abnormal feeling of throat, left neck/back of ear tenderness, whisper voice, left shoulder weakness.   Whisper voice with left neck tenderness and left shoulder weakness, etiology unclear but concerning for focal process. 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   CT neg MRI and MRA neg CUS neg Chest CT negative Ultrasound thyroid 2 small thyroid nodules, no indication for biopsy 2D Echo  EF 60-65% LDL 123 HgbA1c 5.5 ESR and CRP negative. Infectious work up pending SCDs for VTE prophylaxis No antithrombotic prior to admission, on aspirin 81 mg daily for now.  ENT and ID involved, on doxycycline Plan for LP tomorrow If infectious work up neg, and symptoms not getting better, will consider short term steroids trial.  Hypertension BP improved Gradually normalize BP On norvasc and lisinopril for BP management  Long term BP goal normotensive  Hyperlipidemia Home meds:  none  LDL 123, goal < 100 Will discuss with her about starting statin here or by her PCP  Other Risk  Factors Use Mirena  Other [facility] day # 1   Rosalin Hawking, MD PhD Stroke Neurology 03/11/2021 6:01 PM    To contact Stroke Continuity provider, please refer to [email or web address] After hours, contact General Neurology

## 2021-03-11 NOTE — Progress Notes (Signed)
Modified Barium Swallow Progress Note  Patient Details  Name: Alexis Kerr MRN: [id number] Date of Birth: Nov 09, 1960  Today's Date: 03/11/2021  Modified Barium Swallow completed.  Full report located under Chart Review in the Imaging Section.  Brief recommendations include the following:  Clinical Impression  Pt presents with a mild-moderate pharyngeal dysphagia with good airway protection but quite limited mobility. She has reduced pharyngeal contraction and hyolaryngeal excursion, with reduced epiglottic inversion and UES opening as a result. She has residue primarily in the pyriform sinuses although also at times in the valleculae, with good sensation of when this is present. She spontaneously performs multiple swallows, pushing a little more through her UES each time, but showing the biggest increase in pharyngeal clearance when utilizing a head turn to the left. Will continue with primarily liquids today, as pt still does not feel as comfortable taking more solid boluses of any significant size. Will f/u clinically for readiness to advance to some solids with potential for exercises particularly as etiology is better determined.   Swallow Evaluation Recommendations       SLP Diet Recommendations: Thin liquid   Liquid Administration via: Cup;Straw   Medication Administration: Crushed with puree   Supervision: Patient able to self feed   Compensations: Slow rate;Small sips/bites;Other (Comment) (head turn to the LEFT)   Postural Changes: Seated upright at 90 degrees   Oral Care Recommendations: Oral care BID        Mahala Menghini., M.A. CCC-SLP Acute Rehabilitation Services Pager [phone] Office [phone]  03/11/2021,3:04 PM

## 2021-03-11 NOTE — Progress Notes (Signed)
  Speech Language Pathology Treatment: Dysphagia  Patient Details Name: Alexis Kerr MRN: [id number] DOB: Jan 14, 1961 Today's Date: 03/11/2021 Time: 4034-7425 SLP Time Calculation (min) (ACUTE ONLY): 11 min  Assessment / Plan / Recommendation Clinical Impression  Pt seen for ongoing dysphagia management.  Pt's phonation has improved and she is able to get some soft voicing out.  Pt reports some improvement in swallow as well.  Today with trials of thin liquid and puree pt required multiple swallows per bolus.  Pt is very aware of changes and is able to describe sensations in detail. Pt reports feeling of fullness/swelling in throat. With trial of L head turn, pt reported subjective improvement in swallow.  With trial of regular solid there was mild throat clear x2, pt required puree and liquid to follow, pt required multiple swallows, and reported great effort for a very small amount of solid.  Pt was seen by ENT with confirmation of LVF paralysis.    Recommend MBSS to further assess pharyngeal swallow function prior to diet advancement.    HPI HPI: Pt is a 60 y.o. female who presented with L sided weakness, hoarse voice, and difficulty swallowing liquids.  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 MRI brain: Punctate 3 mm focus of enhancement involving the inferior left  basal ganglia. CT neck soft tissue which demonstrated mildly enlarged and geterogenous palatine tonsils which may reflect tonsillitis. 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 Pt failed [facility] swallow screen since she stopped drinking. 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  PMH: untreated HTN.      SLP Plan  MBS       Recommendations  Diet recommendations: Thin liquid Medication Administration: Crushed with puree Supervision: Patient able to self feed Compensations: Slow rate;Small sips/bites Postural Changes and/or Swallow Maneuvers: Seated upright 90 degrees                Oral Care Recommendations: Oral care BID Follow up Recommendations: Outpatient SLP SLP Visit Diagnosis: Aphonia (R49.1);Dysphagia, oropharyngeal phase (R13.12) Plan: MBS       GO                Kerrie Pleasure, MA, CCC-SLP Acute Rehabilitation Services Office: [phone] 03/11/2021, 10:47 AM

## 2021-03-11 NOTE — Progress Notes (Signed)
Pt has c/o of irritation under L-breast area where she had previously had telemetry pads in place. Upon inspection blanchable erythema is present as well as a single small papule. MD notified and orders placed for hydrocortisone cream to affected area. Pt has hx of medical adhesive related skin irritation and requested to have all telemetry pads removed to avoid further irritation. MD notified and telemetry placed on stand-by. Will continue to monitor.

## 2021-03-12 ENCOUNTER — Other Ambulatory Visit (HOSPITAL_COMMUNITY): Payer: Self-pay

## 2021-03-12 DIAGNOSIS — I10 Essential (primary) hypertension: Secondary | ICD-10-CM

## 2021-03-12 DIAGNOSIS — R221 Localized swelling, mass and lump, neck: Secondary | ICD-10-CM

## 2021-03-12 DIAGNOSIS — E041 Nontoxic single thyroid nodule: Secondary | ICD-10-CM

## 2021-03-12 LAB — HEPATITIS PANEL, ACUTE
HCV Ab: NONREACTIVE
Hep A IgM: NONREACTIVE
Hep B C IgM: NONREACTIVE
Hepatitis B Surface Ag: NONREACTIVE

## 2021-03-12 LAB — RHEUMATOID FACTOR: Rheumatoid fact SerPl-aCnc: 21.1 IU/mL — ABNORMAL HIGH (ref <14.0)

## 2021-03-12 LAB — RPR: RPR Ser Ql: NONREACTIVE

## 2021-03-12 LAB — LYME DISEASE SEROLOGY W/REFLEX: Lyme Total Antibody EIA: NEGATIVE

## 2021-03-12 MED ORDER — LISINOPRIL 20 MG PO TABS
20.0000 mg | ORAL_TABLET | Freq: Every day | ORAL | 0 refills | Status: AC
  Filled 2021-03-12: qty 30, 30d supply, fill #0

## 2021-03-12 MED ORDER — CYANOCOBALAMIN 1000 MCG/ML IJ SOLN
1000.0000 ug | Freq: Once | INTRAMUSCULAR | Status: AC
  Administered 2021-03-12: 1000 ug via INTRAMUSCULAR
  Filled 2021-03-12: qty 1

## 2021-03-12 MED ORDER — VITAMIN B-12 1000 MCG PO TABS: 1000.0000 ug | ORAL_TABLET | Freq: Every day | ORAL | Status: DC

## 2021-03-12 MED ORDER — DICLOFENAC SODIUM 1 % EX GEL: 2.0000 g | Freq: Four times a day (QID) | CUTANEOUS | Status: AC

## 2021-03-12 MED ORDER — DOXYCYCLINE HYCLATE 100 MG PO TABS
100.0000 mg | ORAL_TABLET | Freq: Two times a day (BID) | ORAL | 0 refills | Status: AC
  Filled 2021-03-12: qty 12, 6d supply, fill #0

## 2021-03-12 MED ORDER — CYANOCOBALAMIN 1000 MCG PO TABS
1000.0000 ug | ORAL_TABLET | Freq: Every day | ORAL | 0 refills | Status: AC
  Filled 2021-03-12: qty 30, 30d supply, fill #0

## 2021-03-12 MED ORDER — AMLODIPINE BESYLATE 10 MG PO TABS
10.0000 mg | ORAL_TABLET | Freq: Every day | ORAL | 0 refills | Status: AC
Start: 2021-03-13 — End: ?
  Filled 2021-03-12: qty 30, 30d supply, fill #0

## 2021-03-12 NOTE — Discharge Summary (Addendum)
Physician Discharge Summary  Alexis Kerr WKG:[id number] DOB: 1961/05/28 DOA: 03/09/2021  PCP: System, Provider Not In  Please forward records, if possible, to : Radene Ou ENT at [facility] in [place], PCP, [facility]- [phone]  Admit date: 03/09/2021 Discharge date: 03/12/2021  Admitted From: Home Disposition: Home  Recommendations for Outpatient Follow-up:  Please follow-up on pending blood work-see below Please repeat Vit B12 level to follow for improvement  Home Health: None Discharge Condition: Stable CODE STATUS: Full code Diet recommendation: Heart healthy, pured Consultations: Neurology ENT Infectious disease Procedures/Studies: Laryngoscopy   Discharge Diagnoses:  Principal Problem: Left vocal cord paralysis,   Dysphonia Active Problems:   Localized swelling, left ear and left upper neck   Left Trapazeius sprain   Vit B12 deficiency   Essential HTN   Pure hypercholesterolemia   Thyroid nodules     Brief Summary: Alexis Kerr is a 60 year old female who states that on Friday she noticed mild swelling in her throat.  She woke up at 2 AM on Sunday morning and noticed her sore throat and "glands felt chubby".  Later on Sunday she checked COVID which was negative.  When she was taking a shower she realized that she was unable to raise her left arm above her head. It became harder to swallow later and she began choking on liquids.    She notes that she developed itching inside of her left ear on Wednesday and thought it was secondary to a bite.  On the day she presented to the ED, she noticed that the area below her left ear below her jaw and upper neck began to get swollen and tender.  She also noticed that she began to lose her voice. Please see H&P for further details.  Neurology was consulted in the ED.  Work-up for stroke was ordered by neurology.    On my eval on 7/10 > she has a sore throat  on the left side along with an earache and a taste of blood in the back of her throat.  She is still unable to raise her left arm above her head.  She also is complaining of  pain in her forehead.   States that she is unable to swallow solid food.  When she tried to swallow liquids earlier instead of going down her esophagus they came out of her nose.   On exam: I see a small pustule in the left external auditory canal, there is edema and tenderness in the left upper neck/below the mandibular angle.  Tonsils mildly enlarged.  No obvious abnormalities in oropharynx.  There is swelling and tenderness above the left upper scapular area in the trapezius muscle.  She is unable to lift her left arm actively and when done passively she is in too much pain to raise it more than about 110 degrees.    Hospital Course:  Left vocal cord paralysis Localized left-sided swelling of ear, preauricular area and upper neck Possible pustule in left ear- see pictures below - MRI, MRA, carotid ultrasound outlined below- -stroke team/neurology does not feel that her various symptoms are related to a CVA and I agree -CT reveals left vocal cord paralysis -I have spoken with the radiologist, and the swelling in her left upper neck below her jaw does not correlate with anything significant on the CT soft tissue neck -Radiology and ENT both recommend obtaining a CT of the chest to further evaluate the lower and of the  left laryngeal nerve- CT completed and shows no abnormalites -ENT, Dr. Constance Holster, has evaluated her and done a laryngoscope-   The left cord is paralyzed in a paramedian position.  The left arytenoid is angled forward. - He is concerned about a palpable thyroid nodule- ultrasound shows left sided nodules which are quite small and cannot be biopsied  - concern for tick borne illness or autoimmune disorder, ID consulted - 7/11 SLP eval and MBS  > pharyngeal contraction and hyolaryngeal excursion, with reduced  epiglottic inversion and UES opening as a result>  Will continue with primarily liquids today, as pt still does not feel as comfortable taking more solid boluses   - upon discussion with patient, she was willing to try Pureed foods- - ID started Doxycycline - 7/12- tolerating pureed food along with clears -autoimmune work up thus far showing normal CRP and ESR -HIV, RPR, hepatitis panel nonreactive -Group A strep negative -RA latex elevated at 21.1 -Urine drug screen positive for benzodiazepines (she did receive a Xanax) -The following are pending: HSV, angiotensin-converting enzyme, CMV DNA, IgM, IgG, EBV IgM and IgG, Lyme disease serology with reflex and PCR, ANA with reflex -She plans to follow-up with ENT at the [facility] in [place] - She also has an appointment with a new PCP, Dr Gennaro Africa coming up   Left trapezius sprain- unable to lift her left arm above her head - Large area of swelling and tenderness just above her left scapula is compatible with her inability to lift her arm fully - I have ordered Voltaren gel, ice and as needed ibuprofen addition to acetaminophen  -Noted to be improving -On exam today, she was unable to lift the arm above a 90 degree angle however, I was able to passively elevated above her head and she was subsequently able to hold it up-on a second attempt, she was able to elevated herself but said the arm felt heavy-I then checked her strength and strength was noted to be 5/5 -She continues to have tender points in her upper trapezius, upper neck and today and her triceps  Vit B12 deficiency - B12 level is 167 (range 180-914) - given IM b12 on 7/12 and prescribed oral B12  - please follow level as outpt  Essential hypertension - Started on amlodipine and lisinopril which I have prescribed   Hyperlipidemia - total cholesterol 209, HDL 68, LDL is 123 - discussed low cholesterol diet  Left-sided small thyroid nodules - TSH normal at 1.113 - See thyroid  ultrasound mentioned below  Discharge Exam: Vitals:   03/12/21 0730 03/12/21 1128  BP: (!) 149/85 (!) 141/76  Pulse: 77 77  Resp: 16 16  Temp: 98.9 F (37.2 C) 97.6 F (36.4 C)  SpO2: 96% 96%   Vitals:   03/12/21 0000 03/12/21 0401 03/12/21 0730 03/12/21 1128  BP: (!) 153/81 104/69 (!) 149/85 (!) 141/76  Pulse: 80  77 77  Resp: $Remo'15 15 16 16  'ebBll$ Temp: (!) 97.5 F (36.4 C) 98.9 F (37.2 C) 98.9 F (37.2 C) 97.6 F (36.4 C)  TempSrc: Oral Oral Oral Oral  SpO2: 98% (!) 86% 96% 96%  Weight:      Height:        General: Pt is alert, awake, not in acute distress Cardiovascular: RRR, S1/S2 +, no rubs, no gallops Respiratory: CTA bilaterally, no wheezing, no rhonchi Abdominal: Soft, NT, ND, bowel sounds + Extremities: no edema, no cyanosis  Pictures taken on 7/11:  Discharge Instructions  Discharge Instructions     Increase activity slowly   Complete by: As directed       Allergies as of 03/12/2021       Reactions   Cephalexin    Sulfa Antibiotics    Tape    PAPER TAPE ONLY   Bacitracin Rash   Latex Rash        Medication List     TAKE these medications    amLODipine 10 MG tablet Commonly known as: NORVASC Take 1 tablet (10 mg total) by mouth daily. Start taking on: March 13, 2021   cyanocobalamin 1000 MCG tablet Take 1 tablet (1,000 mcg total) by mouth daily. Start taking on: March 13, 2021   diclofenac Sodium 1 % Gel Commonly known as: VOLTAREN Apply 2 g topically 4 (four) times daily.   diphenhydrAMINE 25 MG tablet Commonly known as: BENADRYL Take 50 mg by mouth every 6 (six) hours as needed for allergies.   doxycycline 100 MG tablet Commonly known as: VIBRA-TABS Take 1 tablet (100 mg total) by mouth every 12 (twelve) hours.   ibuprofen 200 MG tablet Commonly known as: ADVIL Take 400 mg by mouth every 6 (six) hours as needed for headache.   levonorgestrel 20 MCG/DAY Iud Commonly known as: MIRENA by Intrauterine route.    lisinopril 20 MG tablet Commonly known as: ZESTRIL Take 1 tablet (20 mg total) by mouth daily. Start taking on: March 13, 2021        Allergies  Allergen Reactions   Cephalexin    Sulfa Antibiotics    Tape     PAPER TAPE ONLY   Bacitracin Rash   Latex Rash      CT Head Wo Contrast  Result Date: 03/09/2021 CLINICAL DATA:  Neuro deficit, acute, stroke suspected EXAM: CT HEAD WITHOUT CONTRAST TECHNIQUE: Contiguous axial images were obtained from the base of the skull through the vertex without intravenous contrast. COMPARISON:  None. FINDINGS: Brain: No acute intracranial abnormality. Specifically, no hemorrhage, hydrocephalus, mass lesion, acute infarction, or significant intracranial injury. Vascular: No hyperdense vessel or unexpected calcification. Skull: No acute calvarial abnormality. Sinuses/Orbits: No acute findings Other: None IMPRESSION: No acute intracranial abnormality. Electronically Signed   By: Rolm Baptise M.D.   On: 03/09/2021 16:44   CT Soft Tissue Neck W Contrast  Result Date: 03/09/2021 CLINICAL DATA:  Neck abscess, deep tissue. EXAM: CT NECK WITH CONTRAST TECHNIQUE: Multidetector CT imaging of the neck was performed using the standard protocol following the bolus administration of intravenous contrast. CONTRAST:  58mL OMNIPAQUE IOHEXOL 300 MG/ML  SOLN COMPARISON:  None. FINDINGS: Pharynx and larynx: Mildly enlarged and heterogeneous palatine tonsils, suggestive of tonsillitis. Salivary glands: No inflammation, mass, or stone. Thyroid: Approximately 8 mm left thyroid nodule, which is not require further imaging follow-up per current guidelines. Lymph nodes: None enlarged or abnormal density. Vascular: Limited evaluation due to non arterial timing with major arteries in the neck appearing grossly patent. Limited intracranial: Negative. Visualized orbits: Negative. Mastoids and visualized paranasal sinuses: Clear. 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 Upper chest: Visualized lung apices are clear. IMPRESSION: 1. Mildly enlarged and heterogeneous palatine tonsils, which may reflect tonsillitis. Recommend correlation with direct inspection. No discrete drainable fluid collection. 2. 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 No evidence of a mass along the expected course of the recurrent laryngeal in the neck; however, the AP window is incompletely imaged  on this study. Recommend non urgent follow-up chest CT with contrast to fully characterize. 3. 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 Electronically Signed   By: Margaretha Sheffield MD   On: 03/09/2021 17:17   CT CHEST W CONTRAST  Result Date: 03/10/2021 CLINICAL DATA:  Upper respiratory illness. Evaluate recurrent laryngeal nerve. Abnormal left vocal cord on neck CT. EXAM: CT CHEST WITH CONTRAST TECHNIQUE: Multidetector CT imaging of the chest was performed during intravenous contrast administration. CONTRAST:  73mL OMNIPAQUE IOHEXOL 300 MG/ML  SOLN COMPARISON:  Neck CT of 1 day prior. Chest radiograph of 1 day prior. FINDINGS: Cardiovascular: Normal aortic caliber. Tortuous thoracic aorta. Borderline cardiomegaly. Mediastinum/Nodes: 7 mm left thyroid nodule Not clinically significant; no follow-up imaging recommended (ref: J Am Coll Radiol. 2015 Feb;12(2): 143-50). No mediastinal or hilar adenopathy.  No mass in the AP window. Esophageal fluid level including on 65/3. Lungs/Pleura: No pleural fluid. Mild left base scarring. No lung mass. Upper Abdomen: Gallbladder stones versus vicarious excretion of contrast is evidenced by dependent hyperattenuation. Suspect at least 1 too small to characterize right hepatic lobe lesion inferiorly. Normal imaged portions of the spleen, stomach, pancreas, adrenal glands, kidneys. Musculoskeletal: No acute osseous abnormality. IMPRESSION:  1. No thoracic mass to explain laryngeal paralysis. 2.  No acute process in the chest. 3. Esophageal air fluid level suggests dysmotility or gastroesophageal reflux. 4. Cholelithiasis versus vicarious excretion of contrast. Electronically Signed   By: Abigail Miyamoto M.D.   On: 03/10/2021 18:49   MR ANGIO HEAD WO CONTRAST  Result Date: 03/10/2021 CLINICAL DATA:  Neuro deficit with stroke suspected EXAM: MRA HEAD WITHOUT CONTRAST TECHNIQUE: Angiographic images of the Circle of Willis were acquired using MRA technique without intravenous contrast. COMPARISON:  No pertinent prior exam. FINDINGS: Anterior circulation: Vessels are smooth and widely patent. Negative for aneurysm Posterior circulation: Vessels are smooth and widely patent. Negative for aneurysm Anatomic variants: None significant IMPRESSION: Normal MRA. Electronically Signed   By: Monte Fantasia M.D.   On: 03/10/2021 07:33   MR Brain W and Wo Contrast  Result Date: 03/09/2021 CLINICAL DATA:  Initial evaluation for possible stroke versus demyelinating disease. EXAM: MRI HEAD WITHOUT AND WITH CONTRAST MRI CERVICAL SPINE WITHOUT AND WITH CONTRAST TECHNIQUE: Multiplanar, multiecho pulse sequences of the brain and surrounding structures, and cervical spine, to include the craniocervical junction and cervicothoracic junction, were obtained without and with intravenous contrast. CONTRAST:  61mL GADAVIST GADOBUTROL 1 MMOL/ML IV SOLN COMPARISON:  Prior CT from earlier the same day. FINDINGS: MRI HEAD FINDINGS Brain: Cerebral volume within normal limits for age. Patchy and hazy T2/FLAIR hyperintensity seen involving the periventricular and deep white matter of both cerebral hemispheres, nonspecific, but overall mild to moderate in nature. Overall, changes are most pronounced within the right frontal lobe (series 11, image 18). Few corresponding areas of T1 hypointensity noted about the basal ganglia (series 16, images 28, 31), favored to reflect small remote  lacunar infarcts. No infratentorial signal abnormality. No abnormal enhancement to suggest acute or subacute ischemia or active demyelination. Gray-white matter differentiation maintained. No encephalomalacia to suggest chronic cortical infarction. No foci of susceptibility artifact to suggest acute or chronic intracranial hemorrhage. No definite mass lesion. No mass effect or midline shift. Ventricles normal in size without hydrocephalus. Cavum et septum pellucidum noted. No extra-axial fluid collection. Pituitary gland and suprasellar region within normal limits. Midline structures intact and normal. Prominent calcification noted along the anterior falx. There is an apparent punctate 3 mm focus of  enhancement involving the inferior left basal ganglia (series 18, image 21). Finding of uncertain etiology or significance. No other abnormal enhancement. Vascular: Major intracranial vascular flow voids are maintained. Skull and upper cervical spine: Craniocervical junction within normal limits. Bone marrow signal intensity normal. No scalp soft tissue abnormality. Sinuses/Orbits: Globes and orbital soft tissues within normal limits. Paranasal sinuses are largely clear. No mastoid effusion. Inner ear structures grossly normal. Other: None. MRI CERVICAL SPINE FINDINGS Alignment: Straightening with mild reversal of the normal cervical lordosis. Trace anterolisthesis of C4 on C5, with trace retrolisthesis of C5 on C6. 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 Vertebral body height maintained without acute or chronic fracture. Bone marrow signal intensity within normal limits. No discrete or worrisome osseous lesions. No abnormal marrow edema or enhancement. Cord: Signal intensity within the cervical spinal cord is normal. Normal cord signal changes to suggest demyelinating disease. Normal cord caliber morphology. No abnormal enhancement. Posterior Fossa,  vertebral arteries, paraspinal tissues: Craniocervical junction within normal limits. Paraspinous and prevertebral soft tissues within normal limits. Normal flow voids seen within the vertebral arteries bilaterally. Disc levels: C2-C3: Unremarkable. C3-C4: Mild right greater than left uncovertebral hypertrophy without significant disc bulge. Mild right-sided facet degeneration. No spinal stenosis. Mild right C4 foraminal narrowing. Left neural foramina remains patent. C4-C5: Mild disc bulge with uncovertebral and endplate spurring. Flattening of the ventral thecal sac without significant spinal stenosis or cord deformity. Mild right C5 foraminal stenosis. Left neural foramina remains patent. C5-C6: Degenerative intervertebral disc space narrowing with diffuse disc osteophyte complex. Broad posterior component flattens and partially faces the ventral thecal sac with mild spinal stenosis. No more than minimal flattening of the ventral cord. Moderate left with mild right C6 foraminal narrowing. C6-C7: Congenital fusion of C6 and C7. No canal or foraminal stenosis. C7-T1: Mild disc bulge with right greater than left uncovertebral hypertrophy. Bilateral facet degeneration. No spinal stenosis. Mild to moderate right C8 foraminal narrowing. Left neural foramina remains patent. Visualized upper thoracic spine demonstrates no significant finding. IMPRESSION: MRI HEAD IMPRESSION: 1. Patchy and hazy T2/FLAIR hyperintensity involving the supratentorial cerebral white matter as above, nonspecific, but overall mild to moderate in nature. 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 Possible demyelinating disease is not entirely excluded, although appearance would be atypical for this process. No convincing changes of active demyelination. 2. Punctate 3 mm focus of enhancement involving the inferior left basal ganglia. Finding is of uncertain etiology or significance, and  could reflect a small vascular structure. Outpatient neurology follow-up with short interval follow-up MRI in 2-3 months to document stability recommended. 3. Otherwise negative brain MRI for age. No other acute intracranial abnormality. MRI CERVICAL SPINE IMPRESSION: 1. Normal MRI appearance of the cervical spinal cord. No evidence for demyelinating disease. 2. 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 Associated mild-to-moderate right C4, C5, C6, and C8 foraminal stenosis, with moderate left C6 foraminal narrowing. Electronically Signed   By: Jeannine Boga M.D.   On: 03/09/2021 23:48   MR Cervical Spine W or Wo Contrast  Result Date: 03/09/2021 CLINICAL DATA:  Initial evaluation for possible stroke versus demyelinating disease. EXAM: MRI HEAD WITHOUT AND WITH CONTRAST MRI CERVICAL SPINE WITHOUT AND WITH CONTRAST TECHNIQUE: Multiplanar, multiecho pulse sequences of the brain and surrounding structures, and cervical spine, to include the craniocervical junction and cervicothoracic junction, were obtained without and with intravenous contrast. CONTRAST:  60mL  GADAVIST GADOBUTROL 1 MMOL/ML IV SOLN COMPARISON:  Prior CT from earlier the same day. FINDINGS: MRI HEAD FINDINGS Brain: Cerebral volume within normal limits for age. Patchy and hazy T2/FLAIR hyperintensity seen involving the periventricular and deep white matter of both cerebral hemispheres, nonspecific, but overall mild to moderate in nature. Overall, changes are most pronounced within the right frontal lobe (series 11, image 18). Few corresponding areas of T1 hypointensity noted about the basal ganglia (series 16, images 28, 31), favored to reflect small remote lacunar infarcts. No infratentorial signal abnormality. No abnormal enhancement to suggest acute or subacute ischemia or active demyelination. Gray-white matter differentiation maintained. No encephalomalacia to suggest chronic cortical infarction. No  foci of susceptibility artifact to suggest acute or chronic intracranial hemorrhage. No definite mass lesion. No mass effect or midline shift. Ventricles normal in size without hydrocephalus. Cavum et septum pellucidum noted. No extra-axial fluid collection. Pituitary gland and suprasellar region within normal limits. Midline structures intact and normal. Prominent calcification noted along the anterior falx. There is an apparent punctate 3 mm focus of enhancement involving the inferior left basal ganglia (series 18, image 21). Finding of uncertain etiology or significance. No other abnormal enhancement. Vascular: Major intracranial vascular flow voids are maintained. Skull and upper cervical spine: Craniocervical junction within normal limits. Bone marrow signal intensity normal. No scalp soft tissue abnormality. Sinuses/Orbits: Globes and orbital soft tissues within normal limits. Paranasal sinuses are largely clear. No mastoid effusion. Inner ear structures grossly normal. Other: None. MRI CERVICAL SPINE FINDINGS Alignment: Straightening with mild reversal of the normal cervical lordosis. Trace anterolisthesis of C4 on C5, with trace retrolisthesis of C5 on C6. 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 Vertebral body height maintained without acute or chronic fracture. Bone marrow signal intensity within normal limits. No discrete or worrisome osseous lesions. No abnormal marrow edema or enhancement. Cord: Signal intensity within the cervical spinal cord is normal. Normal cord signal changes to suggest demyelinating disease. Normal cord caliber morphology. No abnormal enhancement. Posterior Fossa, vertebral arteries, paraspinal tissues: Craniocervical junction within normal limits. Paraspinous and prevertebral soft tissues within normal limits. Normal flow voids seen within the vertebral arteries bilaterally. Disc levels: C2-C3: Unremarkable. C3-C4:  Mild right greater than left uncovertebral hypertrophy without significant disc bulge. Mild right-sided facet degeneration. No spinal stenosis. Mild right C4 foraminal narrowing. Left neural foramina remains patent. C4-C5: Mild disc bulge with uncovertebral and endplate spurring. Flattening of the ventral thecal sac without significant spinal stenosis or cord deformity. Mild right C5 foraminal stenosis. Left neural foramina remains patent. C5-C6: Degenerative intervertebral disc space narrowing with diffuse disc osteophyte complex. Broad posterior component flattens and partially faces the ventral thecal sac with mild spinal stenosis. No more than minimal flattening of the ventral cord. Moderate left with mild right C6 foraminal narrowing. C6-C7: Congenital fusion of C6 and C7. No canal or foraminal stenosis. C7-T1: Mild disc bulge with right greater than left uncovertebral hypertrophy. Bilateral facet degeneration. No spinal stenosis. Mild to moderate right C8 foraminal narrowing. Left neural foramina remains patent. Visualized upper thoracic spine demonstrates no significant finding. IMPRESSION: MRI HEAD IMPRESSION: 1. Patchy and hazy T2/FLAIR hyperintensity involving the supratentorial cerebral white matter as above, nonspecific, but overall mild to moderate in nature. 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 Possible demyelinating disease is not entirely excluded, although appearance would be atypical for this process. No convincing changes of active demyelination. 2.  Punctate 3 mm focus of enhancement involving the inferior left basal ganglia. Finding is of uncertain etiology or significance, and could reflect a small vascular structure. Outpatient neurology follow-up with short interval follow-up MRI in 2-3 months to document stability recommended. 3. Otherwise negative brain MRI for age. No other acute intracranial abnormality. MRI CERVICAL SPINE  IMPRESSION: 1. Normal MRI appearance of the cervical spinal cord. No evidence for demyelinating disease. 2. 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 Associated mild-to-moderate right C4, C5, C6, and C8 foraminal stenosis, with moderate left C6 foraminal narrowing. Electronically Signed   By: Jeannine Boga M.D.   On: 03/09/2021 23:48   DG Chest Portable 1 View  Result Date: 03/09/2021 CLINICAL DATA:  Shortness of breath.  Pharyngitis. EXAM: PORTABLE CHEST 1 VIEW COMPARISON:  None. FINDINGS: The heart size and mediastinal contours are within normal limits. Both lungs are clear. The visualized skeletal structures are unremarkable. IMPRESSION: No active disease. Electronically Signed   By: Claudie Revering M.D.   On: 03/09/2021 14:43   ECHOCARDIOGRAM COMPLETE  Result Date: 03/10/2021    ECHOCARDIOGRAM REPORT   Patient Name:   [facility] Date of Exam: 03/10/2021 Medical Rec #:  [id number]      Height:       63.0 in Accession #:    [id number]     Weight:       165.6 lb Date of Birth:  03/11/1961      BSA:          1.784 m Patient Age:    66 years       BP:           183/97 mmHg Patient Gender: F              HR:           74 bpm. Exam Location:  Inpatient Procedure: 2D Echo, Cardiac Doppler and Color Doppler Indications:    Stroke l63.9  History:        Patient has no prior history of Echocardiogram examinations.                 Risk Factors:Hypertension.  Sonographer:    Alvino Chapel RCS Referring Phys: [phone] [place]  1. Left ventricular ejection fraction, by estimation, is 60 to 65%. The left ventricle has normal function. The left ventricle has no regional wall motion abnormalities. Left ventricular diastolic parameters are consistent with Grade I diastolic dysfunction (impaired relaxation).  2. Right ventricular systolic function is normal. The right ventricular size is normal.  3. The mitral valve is normal in structure. Trivial mitral valve  regurgitation. No evidence of mitral stenosis.  4. The aortic valve is normal in structure. Aortic valve regurgitation is not visualized. No aortic stenosis is present. FINDINGS  Left Ventricle: Left ventricular ejection fraction, by estimation, is 60 to 65%. The left ventricle has normal function. The left ventricle has no regional wall motion abnormalities. The left ventricular internal cavity size was normal in size. There is  no left ventricular hypertrophy. Left ventricular diastolic parameters are consistent with Grade I diastolic dysfunction (impaired relaxation). Right Ventricle: The right ventricular size is normal. No increase in right ventricular wall thickness. Right ventricular systolic function is normal. Left Atrium: Left atrial size was normal in size. Right Atrium: Right atrial size was normal in size. Pericardium: There is no evidence of pericardial effusion. Mitral Valve: The mitral valve is normal in structure. Trivial mitral valve regurgitation. No evidence  of mitral valve stenosis. Tricuspid Valve: The tricuspid valve is normal in structure. Tricuspid valve regurgitation is trivial. Aortic Valve: The aortic valve is normal in structure. Aortic valve regurgitation is not visualized. No aortic stenosis is present. Pulmonic Valve: The pulmonic valve was normal in structure. Pulmonic valve regurgitation is not visualized. Aorta: The aortic root and ascending aorta are structurally normal, with no evidence of dilitation. IAS/Shunts: The atrial septum is grossly normal.  LEFT VENTRICLE PLAX 2D LVIDd:         5.40 cm  Diastology LVIDs:         3.00 cm  LV e' medial:    4.24 cm/s LV PW:         1.00 cm  LV E/e' medial:  11.4 LV IVS:        1.00 cm  LV e' lateral:   6.31 cm/s LVOT diam:     1.80 cm  LV E/e' lateral: 7.7 LV SV:         54 LV SV Index:   30 LVOT Area:     2.54 cm  RIGHT VENTRICLE RV S prime:     14.50 cm/s TAPSE (M-mode): 2.0 cm LEFT ATRIUM           Index       RIGHT ATRIUM            Index LA diam:      3.10 cm 1.74 cm/m  RA Area:     11.50 cm LA Vol (A2C): 34.2 ml 19.17 ml/m RA Volume:   25.10 ml  14.07 ml/m LA Vol (A4C): 49.3 ml 27.63 ml/m  AORTIC VALVE LVOT Vmax:   90.90 cm/s LVOT Vmean:  59.400 cm/s LVOT VTI:    0.211 m  AORTA Ao Root diam: 3.10 cm MITRAL VALVE MV Area (PHT): 4.89 cm    SHUNTS MV Decel Time: 155 msec    Systemic VTI:  0.21 m MV E velocity: 48.30 cm/s  Systemic Diam: 1.80 cm MV A velocity: 74.70 cm/s MV E/A ratio:  0.65 Mertie Moores MD Electronically signed by Mertie Moores MD Signature Date/Time: 03/10/2021/12:40:22 PM    Final    US THYROID  Result Date: 03/11/2021 CLINICAL DATA:  Nodule on CT neck EXAM: THYROID ULTRASOUND TECHNIQUE: Ultrasound examination of the thyroid gland and adjacent soft tissues was performed. COMPARISON:  CT 03/09/2021 FINDINGS: Parenchymal Echotexture: Normal Isthmus: 0.2 cm thickness Right lobe: 4.5 x 1.8 x 1.5 cm Left lobe: 3.7 x 1.3 x 1.4 cm _________________________________________________________ Estimated total number of nodules >/= 1 cm: 0 Number of spongiform nodules >/=  2 cm not described below (TR1): 0 Number of mixed cystic and solid nodules >/= 1.5 cm not described below (Yeager): 0 _________________________________________________________ Nodule # 1: 0.8 cm isoechoic nodule without calcifications, inferior left; This nodule does NOT meet TI-RADS criteria for biopsy or dedicated follow-up. Nodule # 2: 0.7 cm hypoechoic nodule without calcifications, inferior left; This nodule does NOT meet TI-RADS criteria for biopsy or dedicated follow-up. No regional cervical adenopathy identified. Limited images of submandibular glands unremarkable. IMPRESSION: 1. Normal-sized thyroid with small left nodules, which do not meet criteria for biopsy or follow-up. The above is in keeping with the ACR TI-RADS recommendations - J Am Coll Radiol 2017;14:587-595. Electronically Signed   By: Lucrezia Europe M.D.   On: 03/11/2021 07:42   VAS US  CAROTID  Result Date: 03/11/2021 Carotid Arterial Duplex Study Patient Name:  Alexis Kerr  Date of Exam:   03/10/2021 Medical Rec #:  [id number]       Accession #:    [id number] Date of Birth: Jul 11, 1961       Patient Gender: F Patient Age:   40Y Exam Location:  [facility] Procedure:      VAS US CAROTID Referring Phys: 40 JARED M GARDNER --------------------------------------------------------------------------------  Indications:       Weakness and Left sided weakness and hoarse voice. Risk Factors:      Hypertension. Comparison Study:  No prior study Performing Technologist: Sharion Dove RVS  Examination Guidelines: A complete evaluation includes B-mode imaging, spectral Doppler, color Doppler, and power Doppler as needed of all accessible portions of each vessel. Bilateral testing is considered an integral part of a complete examination. Limited examinations for reoccurring indications may be performed as noted.  Right Carotid Findings: +----------+--------+--------+--------+------------------+------------------+           PSV cm/sEDV cm/sStenosisPlaque DescriptionComments           +----------+--------+--------+--------+------------------+------------------+ CCA Prox  94      26                                intimal thickening +----------+--------+--------+--------+------------------+------------------+ CCA Distal104     27                                intimal thickening +----------+--------+--------+--------+------------------+------------------+ ICA Prox  48      16                                                   +----------+--------+--------+--------+------------------+------------------+ ICA Distal91      34                                                   +----------+--------+--------+--------+------------------+------------------+ ECA       104     16                                                    +----------+--------+--------+--------+------------------+------------------+ +----------+--------+-------+--------+-------------------+           PSV cm/sEDV cmsDescribeArm Pressure (mmHG) +----------+--------+-------+--------+-------------------+ [id number]                                         +----------+--------+-------+--------+-------------------+ +---------+--------+--+--------+--+ VertebralPSV cm/s87EDV cm/s24 +---------+--------+--+--------+--+  Left Carotid Findings: +----------+--------+--------+--------+------------------+------------------+           PSV cm/sEDV cm/sStenosisPlaque DescriptionComments           +----------+--------+--------+--------+------------------+------------------+ CCA Prox  99      26                                intimal thickening +----------+--------+--------+--------+------------------+------------------+ CCA Distal81      32  intimal thickening +----------+--------+--------+--------+------------------+------------------+ ICA Prox  76      26                                                   +----------+--------+--------+--------+------------------+------------------+ ICA Distal94      36                                                   +----------+--------+--------+--------+------------------+------------------+ ECA       128     28                                                   +----------+--------+--------+--------+------------------+------------------+ +----------+--------+--------+--------+-------------------+           PSV cm/sEDV cm/sDescribeArm Pressure (mmHG) +----------+--------+--------+--------+-------------------+ [id number]                                          +----------+--------+--------+--------+-------------------+ +---------+--------+--+--------+--+ VertebralPSV cm/s78EDV cm/s24 +---------+--------+--+--------+--+   Summary: Right  Carotid: The extracranial vessels were near-normal with only minimal wall                thickening or plaque. Left Carotid: The extracranial vessels were near-normal with only minimal wall               thickening or plaque. Vertebrals:  Bilateral vertebral arteries demonstrate antegrade flow. Subclavians: Normal flow hemodynamics were seen in bilateral subclavian              arteries. *See table(s) above for measurements and observations.  Electronically signed by Monica Martinez MD on 03/11/2021 at 10:23:49 AM.    Final      The results of significant diagnostics from this hospitalization (including imaging, microbiology, ancillary and laboratory) are listed below for reference.     Microbiology: Recent Results (from the past 240 hour(s))  Resp Panel by RT-PCR (Flu A&B, Covid) Nasopharyngeal Swab     Status: None   Collection Time: 03/09/21  2:31 PM   Specimen: Nasopharyngeal Swab; Nasopharyngeal(NP) swabs in vial transport medium  Result Value Ref Range Status   SARS Coronavirus 2 by RT PCR NEGATIVE NEGATIVE Final    Comment: (NOTE) SARS-CoV-2 target nucleic acids are NOT DETECTED.  The SARS-CoV-2 RNA is generally detectable in upper respiratory specimens during the acute phase of infection. The lowest concentration of SARS-CoV-2 viral copies this assay can detect is 138 copies/mL. A negative result does not preclude SARS-Cov-2 infection and should not be used as the sole basis for treatment or other patient management decisions. A negative result may occur with  improper specimen collection/handling, submission of specimen other than nasopharyngeal swab, presence of viral mutation(s) within the areas targeted by this assay, and inadequate number of viral copies(<138 copies/mL). A negative result must be combined with clinical observations, patient history, and epidemiological information. The expected result is Negative.  Fact Sheet for Patients:   EntrepreneurPulse.com.au  Fact Sheet for Healthcare Providers:  IncredibleEmployment.be  This test is no t yet approved  or cleared by the [place] and  has been authorized for detection and/or diagnosis of SARS-CoV-2 by FDA under an Emergency Use Authorization (EUA). This EUA will remain  in effect (meaning this test can be used) for the duration of the COVID-19 declaration under Section 564(b)(1) of the Act, 21 U.S.C.section 360bbb-3(b)(1), unless the authorization is terminated  or revoked sooner.       Influenza A by PCR NEGATIVE NEGATIVE Final   Influenza B by PCR NEGATIVE NEGATIVE Final    Comment: (NOTE) The Xpert Xpress SARS-CoV-2/FLU/RSV plus assay is intended as an aid in the diagnosis of influenza from Nasopharyngeal swab specimens and should not be used as a sole basis for treatment. Nasal washings and aspirates are unacceptable for Xpert Xpress SARS-CoV-2/FLU/RSV testing.  Fact Sheet for Patients: EntrepreneurPulse.com.au  Fact Sheet for Healthcare Providers: IncredibleEmployment.be  This test is not yet approved or cleared by the [place] FDA and has been authorized for detection and/or diagnosis of SARS-CoV-2 by FDA under an Emergency Use Authorization (EUA). This EUA will remain in effect (meaning this test can be used) for the duration of the COVID-19 declaration under Section 564(b)(1) of the Act, 21 U.S.C. section 360bbb-3(b)(1), unless the authorization is terminated or revoked.  Performed at [facility], [address], [place], [place] [address]   Group A Strep by PCR     Status: None   Collection Time: 03/09/21  4:39 PM   Specimen: Throat; Sterile Swab  Result Value Ref Range Status   Group A Strep by PCR NOT DETECTED NOT DETECTED Final    Comment: Performed at [facility], [address] [place], [place] [address]      Labs: BNP (last 3 results) No results for input(s): BNP in the last 8760 hours. Basic Metabolic Panel: Recent Labs  Lab 03/09/21 1431 03/11/21 2156  NA 141 139  K 3.8 3.5  CL 105 104  CO2 27 25  GLUCOSE 94 135*  BUN 15 9  CREATININE 0.72 0.75  CALCIUM 9.6 9.6   Liver Function Tests: Recent Labs  Lab 03/09/21 1431  AST 17  ALT 17  ALKPHOS 72  BILITOT 0.7  PROT 7.4  ALBUMIN 4.6   No results for input(s): LIPASE, AMYLASE in the last 168 hours. No results for input(s): AMMONIA in the last 168 hours. CBC: Recent Labs  Lab 03/09/21 1431  WBC 7.3  NEUTROABS 4.5  HGB 14.0  HCT 41.0  MCV 87.4  PLT 246   Cardiac Enzymes: No results for input(s): CKTOTAL, CKMB, CKMBINDEX, TROPONINI in the last 168 hours. BNP: Invalid input(s): POCBNP CBG: No results for input(s): GLUCAP in the last 168 hours. D-Dimer No results for input(s): DDIMER in the last 72 hours. Hgb A1c Recent Labs    03/10/21 0402  HGBA1C 5.5   Lipid Profile Recent Labs    03/10/21 0402  CHOL 209*  HDL 68  LDLCALC 123*  TRIG 90  CHOLHDL 3.1   Thyroid function studies Recent Labs    03/11/21 1022  TSH 1.113   Anemia work up Recent Labs    03/11/21 1022  VITAMINB12 167*   Urinalysis No results found for: COLORURINE, APPEARANCEUR, LABSPEC, [place], GLUCOSEU, [place], BILIRUBINUR, KETONESUR, PROTEINUR, UROBILINOGEN, NITRITE, LEUKOCYTESUR Sepsis Labs Invalid input(s): PROCALCITONIN,  WBC,  LACTICIDVEN Microbiology Recent Results (from the past 240 hour(s))  Resp Panel by RT-PCR (Flu A&B, Covid) Nasopharyngeal Swab     Status: None   Collection Time: 03/09/21  2:31 PM  Specimen: Nasopharyngeal Swab; Nasopharyngeal(NP) swabs in vial transport medium  Result Value Ref Range Status   SARS Coronavirus 2 by RT PCR NEGATIVE NEGATIVE Final    Comment: (NOTE) SARS-CoV-2 target nucleic acids are NOT DETECTED.  The SARS-CoV-2 RNA is generally detectable in upper respiratory specimens during  the acute phase of infection. The lowest concentration of SARS-CoV-2 viral copies this assay can detect is 138 copies/mL. A negative result does not preclude SARS-Cov-2 infection and should not be used as the sole basis for treatment or other patient management decisions. A negative result may occur with  improper specimen collection/handling, submission of specimen other than nasopharyngeal swab, presence of viral mutation(s) within the areas targeted by this assay, and inadequate number of viral copies(<138 copies/mL). A negative result must be combined with clinical observations, patient history, and epidemiological information. The expected result is Negative.  Fact Sheet for Patients:  EntrepreneurPulse.com.au  Fact Sheet for Healthcare Providers:  IncredibleEmployment.be  This test is no t yet approved or cleared by the [place] FDA and  has been authorized for detection and/or diagnosis of SARS-CoV-2 by FDA under an Emergency Use Authorization (EUA). This EUA will remain  in effect (meaning this test can be used) for the duration of the COVID-19 declaration under Section 564(b)(1) of the Act, 21 U.S.C.section 360bbb-3(b)(1), unless the authorization is terminated  or revoked sooner.       Influenza A by PCR NEGATIVE NEGATIVE Final   Influenza B by PCR NEGATIVE NEGATIVE Final    Comment: (NOTE) The Xpert Xpress SARS-CoV-2/FLU/RSV plus assay is intended as an aid in the diagnosis of influenza from Nasopharyngeal swab specimens and should not be used as a sole basis for treatment. Nasal washings and aspirates are unacceptable for Xpert Xpress SARS-CoV-2/FLU/RSV testing.  Fact Sheet for Patients: EntrepreneurPulse.com.au  Fact Sheet for Healthcare Providers: IncredibleEmployment.be  This test is not yet approved or cleared by the [place] FDA and has been authorized for detection and/or  diagnosis of SARS-CoV-2 by FDA under an Emergency Use Authorization (EUA). This EUA will remain in effect (meaning this test can be used) for the duration of the COVID-19 declaration under Section 564(b)(1) of the Act, 21 U.S.C. section 360bbb-3(b)(1), unless the authorization is terminated or revoked.  Performed at [facility], [address] [place], [place] [address]   Group A Strep by PCR     Status: None   Collection Time: 03/09/21  4:39 PM   Specimen: Throat; Sterile Swab  Result Value Ref Range Status   Group A Strep by PCR NOT DETECTED NOT DETECTED Final    Comment: Performed at [facility], [address], [place], [place] [address]     Time coordinating discharge in minutes: 65  SIGNED:   Debbe Odea, MD  [facility] 03/12/2021, 1:48 PM

## 2021-03-12 NOTE — Progress Notes (Signed)
STROKE TEAM PROGRESS NOTE   SUBJECTIVE (INTERVAL HISTORY) Her husband and daughter are at the bedside.  Patient voice seems slightly better and louder. Pt plans to go back to [facility] Thursday and has ENT appointment at [facility] next Tuesday. She prefer to hold off LP and seek for second opinion at [facility].     OBJECTIVE Temp:  [97.5 F (36.4 C)-98.9 F (37.2 C)] 97.6 F (36.4 C) (07/12 1128) Pulse Rate:  [74-80] 77 (07/12 1128) Cardiac Rhythm: Normal sinus rhythm (07/11 1900) Resp:  [14-16] 16 (07/12 1128) BP: (104-154)/(69-104) 141/76 (07/12 1128) SpO2:  [86 %-98 %] 96 % (07/12 1128)  No results for input(s): GLUCAP in the last 168 hours. Recent Labs  Lab 03/09/21 1431 03/11/21 2156  NA 141 139  K 3.8 3.5  CL 105 104  CO2 27 25  GLUCOSE 94 135*  BUN 15 9  CREATININE 0.72 0.75  CALCIUM 9.6 9.6   Recent Labs  Lab 03/09/21 1431  AST 17  ALT 17  ALKPHOS 72  BILITOT 0.7  PROT 7.4  ALBUMIN 4.6   Recent Labs  Lab 03/09/21 1431  WBC 7.3  NEUTROABS 4.5  HGB 14.0  HCT 41.0  MCV 87.4  PLT 246   No results for input(s): CKTOTAL, CKMB, CKMBINDEX, TROPONINI in the last 168 hours. No results for input(s): LABPROT, INR in the last 72 hours. No results for input(s): COLORURINE, LABSPEC, Ashley, GLUCOSEU, HGBUR, BILIRUBINUR, KETONESUR, PROTEINUR, UROBILINOGEN, NITRITE, LEUKOCYTESUR in the last 72 hours.  Invalid input(s): APPERANCEUR     Component Value Date/Time   CHOL 209 (H) 03/10/2021 0402   TRIG 90 03/10/2021 0402   HDL 68 03/10/2021 0402   CHOLHDL 3.1 03/10/2021 0402   VLDL 18 03/10/2021 0402   LDLCALC 123 (H) 03/10/2021 0402   Lab Results  Component Value Date   HGBA1C 5.5 03/10/2021      Component Value Date/Time   LABOPIA NONE DETECTED 03/11/2021 1430   COCAINSCRNUR NONE DETECTED 03/11/2021 1430   LABBENZ POSITIVE (A) 03/11/2021 1430   AMPHETMU NONE DETECTED 03/11/2021 1430   THCU NONE DETECTED 03/11/2021 1430   LABBARB NONE DETECTED 03/11/2021 1430     No results for input(s): ETH in the last 168 hours.  I have personally reviewed the radiological images below and agree with the radiology interpretations.  CT Head Wo Contrast  Result Date: 03/09/2021 CLINICAL DATA:  Neuro deficit, acute, stroke suspected EXAM: CT HEAD WITHOUT CONTRAST TECHNIQUE: Contiguous axial images were obtained from the base of the skull through the vertex without intravenous contrast. COMPARISON:  None. FINDINGS: Brain: No acute intracranial abnormality. Specifically, no hemorrhage, hydrocephalus, mass lesion, acute infarction, or significant intracranial injury. Vascular: No hyperdense vessel or unexpected calcification. Skull: No acute calvarial abnormality. Sinuses/Orbits: No acute findings Other: None IMPRESSION: No acute intracranial abnormality. Electronically Signed   By: Rolm Baptise M.D.   On: 03/09/2021 16:44   CT Soft Tissue Neck W Contrast  Result Date: 03/09/2021 CLINICAL DATA:  Neck abscess, deep tissue. EXAM: CT NECK WITH CONTRAST TECHNIQUE: Multidetector CT imaging of the neck was performed using the standard protocol following the bolus administration of intravenous contrast. CONTRAST:  77mL OMNIPAQUE IOHEXOL 300 MG/ML  SOLN COMPARISON:  None. FINDINGS: Pharynx and larynx: Mildly enlarged and heterogeneous palatine tonsils, suggestive of tonsillitis. Salivary glands: No inflammation, mass, or stone. Thyroid: Approximately 8 mm left thyroid nodule, which is not require further imaging follow-up per current guidelines. Lymph nodes: None enlarged or abnormal density. Vascular: Limited evaluation due  to non arterial timing with major arteries in the neck appearing grossly patent. Limited intracranial: Negative. Visualized orbits: Negative. Mastoids and visualized paranasal sinuses: Clear. 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 Upper chest: Visualized lung apices are clear. IMPRESSION: 1. Mildly enlarged and heterogeneous  palatine tonsils, which may reflect tonsillitis. Recommend correlation with direct inspection. No discrete drainable fluid collection. 2. 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 No evidence of a mass along the expected course of the recurrent laryngeal in the neck; however, the AP window is incompletely imaged on this study. Recommend non urgent follow-up chest CT with contrast to fully characterize. 3. 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 Electronically Signed   By: Margaretha Sheffield MD   On: 03/09/2021 17:17   CT CHEST W CONTRAST  Result Date: 03/10/2021 CLINICAL DATA:  Upper respiratory illness. Evaluate recurrent laryngeal nerve. Abnormal left vocal cord on neck CT. EXAM: CT CHEST WITH CONTRAST TECHNIQUE: Multidetector CT imaging of the chest was performed during intravenous contrast administration. CONTRAST:  56mL OMNIPAQUE IOHEXOL 300 MG/ML  SOLN COMPARISON:  Neck CT of 1 day prior. Chest radiograph of 1 day prior. FINDINGS: Cardiovascular: Normal aortic caliber. Tortuous thoracic aorta. Borderline cardiomegaly. Mediastinum/Nodes: 7 mm left thyroid nodule Not clinically significant; no follow-up imaging recommended (ref: J Am Coll Radiol. 2015 Feb;12(2): 143-50). No mediastinal or hilar adenopathy.  No mass in the AP window. Esophageal fluid level including on 65/3. Lungs/Pleura: No pleural fluid. Mild left base scarring. No lung mass. Upper Abdomen: Gallbladder stones versus vicarious excretion of contrast is evidenced by dependent hyperattenuation. Suspect at least 1 too small to characterize right hepatic lobe lesion inferiorly. Normal imaged portions of the spleen, stomach, pancreas, adrenal glands, kidneys. Musculoskeletal: No acute osseous abnormality. IMPRESSION: 1. No thoracic mass to explain laryngeal paralysis. 2.  No acute process in the chest. 3. Esophageal air fluid level  suggests dysmotility or gastroesophageal reflux. 4. Cholelithiasis versus vicarious excretion of contrast. Electronically Signed   By: Abigail Miyamoto M.D.   On: 03/10/2021 18:49   MR ANGIO HEAD WO CONTRAST  Result Date: 03/10/2021 CLINICAL DATA:  Neuro deficit with stroke suspected EXAM: MRA HEAD WITHOUT CONTRAST TECHNIQUE: Angiographic images of the Circle of Willis were acquired using MRA technique without intravenous contrast. COMPARISON:  No pertinent prior exam. FINDINGS: Anterior circulation: Vessels are smooth and widely patent. Negative for aneurysm Posterior circulation: Vessels are smooth and widely patent. Negative for aneurysm Anatomic variants: None significant IMPRESSION: Normal MRA. Electronically Signed   By: Monte Fantasia M.D.   On: 03/10/2021 07:33   MR Brain W and Wo Contrast  Result Date: 03/09/2021 CLINICAL DATA:  Initial evaluation for possible stroke versus demyelinating disease. EXAM: MRI HEAD WITHOUT AND WITH CONTRAST MRI CERVICAL SPINE WITHOUT AND WITH CONTRAST TECHNIQUE: Multiplanar, multiecho pulse sequences of the brain and surrounding structures, and cervical spine, to include the craniocervical junction and cervicothoracic junction, were obtained without and with intravenous contrast. CONTRAST:  78mL GADAVIST GADOBUTROL 1 MMOL/ML IV SOLN COMPARISON:  Prior CT from earlier the same day. FINDINGS: MRI HEAD FINDINGS Brain: Cerebral volume within normal limits for age. Patchy and hazy T2/FLAIR hyperintensity seen involving the periventricular and deep white matter of both cerebral hemispheres, nonspecific, but overall mild to moderate in nature. Overall, changes are most pronounced within the right frontal lobe (series 11, image 18). Few corresponding areas of T1 hypointensity noted about the basal ganglia (  series 16, images 28, 31), favored to reflect small remote lacunar infarcts. No infratentorial signal abnormality. No abnormal enhancement to suggest acute or subacute ischemia or  active demyelination. Gray-white matter differentiation maintained. No encephalomalacia to suggest chronic cortical infarction. No foci of susceptibility artifact to suggest acute or chronic intracranial hemorrhage. No definite mass lesion. No mass effect or midline shift. Ventricles normal in size without hydrocephalus. Cavum et septum pellucidum noted. No extra-axial fluid collection. Pituitary gland and suprasellar region within normal limits. Midline structures intact and normal. Prominent calcification noted along the anterior falx. There is an apparent punctate 3 mm focus of enhancement involving the inferior left basal ganglia (series 18, image 21). Finding of uncertain etiology or significance. No other abnormal enhancement. Vascular: Major intracranial vascular flow voids are maintained. Skull and upper cervical spine: Craniocervical junction within normal limits. Bone marrow signal intensity normal. No scalp soft tissue abnormality. Sinuses/Orbits: Globes and orbital soft tissues within normal limits. Paranasal sinuses are largely clear. No mastoid effusion. Inner ear structures grossly normal. Other: None. MRI CERVICAL SPINE FINDINGS Alignment: Straightening with mild reversal of the normal cervical lordosis. Trace anterolisthesis of C4 on C5, with trace retrolisthesis of C5 on C6. 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 Vertebral body height maintained without acute or chronic fracture. Bone marrow signal intensity within normal limits. No discrete or worrisome osseous lesions. No abnormal marrow edema or enhancement. Cord: Signal intensity within the cervical spinal cord is normal. Normal cord signal changes to suggest demyelinating disease. Normal cord caliber morphology. No abnormal enhancement. Posterior Fossa, vertebral arteries, paraspinal tissues: Craniocervical junction within normal limits. Paraspinous and prevertebral soft  tissues within normal limits. Normal flow voids seen within the vertebral arteries bilaterally. Disc levels: C2-C3: Unremarkable. C3-C4: Mild right greater than left uncovertebral hypertrophy without significant disc bulge. Mild right-sided facet degeneration. No spinal stenosis. Mild right C4 foraminal narrowing. Left neural foramina remains patent. C4-C5: Mild disc bulge with uncovertebral and endplate spurring. Flattening of the ventral thecal sac without significant spinal stenosis or cord deformity. Mild right C5 foraminal stenosis. Left neural foramina remains patent. C5-C6: Degenerative intervertebral disc space narrowing with diffuse disc osteophyte complex. Broad posterior component flattens and partially faces the ventral thecal sac with mild spinal stenosis. No more than minimal flattening of the ventral cord. Moderate left with mild right C6 foraminal narrowing. C6-C7: Congenital fusion of C6 and C7. No canal or foraminal stenosis. C7-T1: Mild disc bulge with right greater than left uncovertebral hypertrophy. Bilateral facet degeneration. No spinal stenosis. Mild to moderate right C8 foraminal narrowing. Left neural foramina remains patent. Visualized upper thoracic spine demonstrates no significant finding. IMPRESSION: MRI HEAD IMPRESSION: 1. Patchy and hazy T2/FLAIR hyperintensity involving the supratentorial cerebral white matter as above, nonspecific, but overall mild to moderate in nature. 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 Possible demyelinating disease is not entirely excluded, although appearance would be atypical for this process. No convincing changes of active demyelination. 2. Punctate 3 mm focus of enhancement involving the inferior left basal ganglia. Finding is of uncertain etiology or significance, and could reflect a small vascular structure. Outpatient neurology follow-up with short interval follow-up MRI in 2-3 months to  document stability recommended. 3. Otherwise negative brain MRI for age. No other acute intracranial abnormality. MRI CERVICAL SPINE IMPRESSION: 1. Normal MRI appearance of the cervical spinal cord. No evidence for demyelinating disease. 2. 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 Associated mild-to-moderate right C4, C5, C6, and C8 foraminal stenosis, with moderate left C6 foraminal narrowing. Electronically Signed   By: Jeannine Boga M.D.   On: 03/09/2021 23:48   MR Cervical Spine W or Wo Contrast  Result Date: 03/09/2021 CLINICAL DATA:  Initial evaluation for possible stroke versus demyelinating disease. EXAM: MRI HEAD WITHOUT AND WITH CONTRAST MRI CERVICAL SPINE WITHOUT AND WITH CONTRAST TECHNIQUE: Multiplanar, multiecho pulse sequences of the brain and surrounding structures, and cervical spine, to include the craniocervical junction and cervicothoracic junction, were obtained without and with intravenous contrast. CONTRAST:  62mL GADAVIST GADOBUTROL 1 MMOL/ML IV SOLN COMPARISON:  Prior CT from earlier the same day. FINDINGS: MRI HEAD FINDINGS Brain: Cerebral volume within normal limits for age. Patchy and hazy T2/FLAIR hyperintensity seen involving the periventricular and deep white matter of both cerebral hemispheres, nonspecific, but overall mild to moderate in nature. Overall, changes are most pronounced within the right frontal lobe (series 11, image 18). Few corresponding areas of T1 hypointensity noted about the basal ganglia (series 16, images 28, 31), favored to reflect small remote lacunar infarcts. No infratentorial signal abnormality. No abnormal enhancement to suggest acute or subacute ischemia or active demyelination. Gray-white matter differentiation maintained. No encephalomalacia to suggest chronic cortical infarction. No foci of susceptibility artifact to suggest acute or chronic intracranial hemorrhage. No definite mass lesion. No mass effect  or midline shift. Ventricles normal in size without hydrocephalus. Cavum et septum pellucidum noted. No extra-axial fluid collection. Pituitary gland and suprasellar region within normal limits. Midline structures intact and normal. Prominent calcification noted along the anterior falx. There is an apparent punctate 3 mm focus of enhancement involving the inferior left basal ganglia (series 18, image 21). Finding of uncertain etiology or significance. No other abnormal enhancement. Vascular: Major intracranial vascular flow voids are maintained. Skull and upper cervical spine: Craniocervical junction within normal limits. Bone marrow signal intensity normal. No scalp soft tissue abnormality. Sinuses/Orbits: Globes and orbital soft tissues within normal limits. Paranasal sinuses are largely clear. No mastoid effusion. Inner ear structures grossly normal. Other: None. MRI CERVICAL SPINE FINDINGS Alignment: Straightening with mild reversal of the normal cervical lordosis. Trace anterolisthesis of C4 on C5, with trace retrolisthesis of C5 on C6. 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 Vertebral body height maintained without acute or chronic fracture. Bone marrow signal intensity within normal limits. No discrete or worrisome osseous lesions. No abnormal marrow edema or enhancement. Cord: Signal intensity within the cervical spinal cord is normal. Normal cord signal changes to suggest demyelinating disease. Normal cord caliber morphology. No abnormal enhancement. Posterior Fossa, vertebral arteries, paraspinal tissues: Craniocervical junction within normal limits. Paraspinous and prevertebral soft tissues within normal limits. Normal flow voids seen within the vertebral arteries bilaterally. Disc levels: C2-C3: Unremarkable. C3-C4: Mild right greater than left uncovertebral hypertrophy without significant disc bulge. Mild right-sided facet degeneration.  No spinal stenosis. Mild right C4 foraminal narrowing. Left neural foramina remains patent. C4-C5: Mild disc bulge with uncovertebral and endplate spurring. Flattening of the ventral thecal sac without significant spinal stenosis or cord deformity. Mild right C5 foraminal stenosis. Left neural foramina remains patent. C5-C6: Degenerative intervertebral disc space narrowing with diffuse disc osteophyte complex. Broad posterior component flattens and partially faces the ventral thecal sac with mild spinal stenosis. No more than minimal flattening of the ventral cord. Moderate left with mild right C6 foraminal narrowing. C6-C7: Congenital fusion of  C6 and C7. No canal or foraminal stenosis. C7-T1: Mild disc bulge with right greater than left uncovertebral hypertrophy. Bilateral facet degeneration. No spinal stenosis. Mild to moderate right C8 foraminal narrowing. Left neural foramina remains patent. Visualized upper thoracic spine demonstrates no significant finding. IMPRESSION: MRI HEAD IMPRESSION: 1. Patchy and hazy T2/FLAIR hyperintensity involving the supratentorial cerebral white matter as above, nonspecific, but overall mild to moderate in nature. 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 Possible demyelinating disease is not entirely excluded, although appearance would be atypical for this process. No convincing changes of active demyelination. 2. Punctate 3 mm focus of enhancement involving the inferior left basal ganglia. Finding is of uncertain etiology or significance, and could reflect a small vascular structure. Outpatient neurology follow-up with short interval follow-up MRI in 2-3 months to document stability recommended. 3. Otherwise negative brain MRI for age. No other acute intracranial abnormality. MRI CERVICAL SPINE IMPRESSION: 1. Normal MRI appearance of the cervical spinal cord. No evidence for demyelinating disease. 2. Multilevel  degenerative spondylosis with resultant mild spinal stenosis at C5-6. Associated mild-to-moderate right C4, C5, C6, and C8 foraminal stenosis, with moderate left C6 foraminal narrowing. Electronically Signed   By: Jeannine Boga M.D.   On: 03/09/2021 23:48   DG Chest Portable 1 View  Result Date: 03/09/2021 CLINICAL DATA:  Shortness of breath.  Pharyngitis. EXAM: PORTABLE CHEST 1 VIEW COMPARISON:  None. FINDINGS: The heart size and mediastinal contours are within normal limits. Both lungs are clear. The visualized skeletal structures are unremarkable. IMPRESSION: No active disease. Electronically Signed   By: Claudie Revering M.D.   On: 03/09/2021 14:43   ECHOCARDIOGRAM COMPLETE  Result Date: 03/10/2021    ECHOCARDIOGRAM REPORT   Patient Name:   [facility] Date of Exam: 03/10/2021 Medical Rec #:  [id number]      Height:       63.0 in Accession #:    [id number]     Weight:       165.6 lb Date of Birth:  14-Apr-1961      BSA:          1.784 m Patient Age:    35 years       BP:           183/97 mmHg Patient Gender: F              HR:           74 bpm. Exam Location:  Inpatient Procedure: 2D Echo, Cardiac Doppler and Color Doppler Indications:    Stroke l63.9  History:        Patient has no prior history of Echocardiogram examinations.                 Risk Factors:Hypertension.  Sonographer:    Alvino Chapel RCS Referring Phys: [phone] [place]  1. Left ventricular ejection fraction, by estimation, is 60 to 65%. The left ventricle has normal function. The left ventricle has no regional wall motion abnormalities. Left ventricular diastolic parameters are consistent with Grade I diastolic dysfunction (impaired relaxation).  2. Right ventricular systolic function is normal. The right ventricular size is normal.  3. The mitral valve is normal in structure. Trivial mitral valve regurgitation. No evidence of mitral stenosis.  4. The aortic valve is normal in structure. Aortic valve regurgitation is not  visualized. No aortic stenosis is present. FINDINGS  Left Ventricle: Left ventricular ejection fraction, by estimation, is 60 to 65%. The  left ventricle has normal function. The left ventricle has no regional wall motion abnormalities. The left ventricular internal cavity size was normal in size. There is  no left ventricular hypertrophy. Left ventricular diastolic parameters are consistent with Grade I diastolic dysfunction (impaired relaxation). Right Ventricle: The right ventricular size is normal. No increase in right ventricular wall thickness. Right ventricular systolic function is normal. Left Atrium: Left atrial size was normal in size. Right Atrium: Right atrial size was normal in size. Pericardium: There is no evidence of pericardial effusion. Mitral Valve: The mitral valve is normal in structure. Trivial mitral valve regurgitation. No evidence of mitral valve stenosis. Tricuspid Valve: The tricuspid valve is normal in structure. Tricuspid valve regurgitation is trivial. Aortic Valve: The aortic valve is normal in structure. Aortic valve regurgitation is not visualized. No aortic stenosis is present. Pulmonic Valve: The pulmonic valve was normal in structure. Pulmonic valve regurgitation is not visualized. Aorta: The aortic root and ascending aorta are structurally normal, with no evidence of dilitation. IAS/Shunts: The atrial septum is grossly normal.  LEFT VENTRICLE PLAX 2D LVIDd:         5.40 cm  Diastology LVIDs:         3.00 cm  LV e' medial:    4.24 cm/s LV PW:         1.00 cm  LV E/e' medial:  11.4 LV IVS:        1.00 cm  LV e' lateral:   6.31 cm/s LVOT diam:     1.80 cm  LV E/e' lateral: 7.7 LV SV:         54 LV SV Index:   30 LVOT Area:     2.54 cm  RIGHT VENTRICLE RV S prime:     14.50 cm/s TAPSE (M-mode): 2.0 cm LEFT ATRIUM           Index       RIGHT ATRIUM           Index LA diam:      3.10 cm 1.74 cm/m  RA Area:     11.50 cm LA Vol (A2C): 34.2 ml 19.17 ml/m RA Volume:   25.10 ml  14.07  ml/m LA Vol (A4C): 49.3 ml 27.63 ml/m  AORTIC VALVE LVOT Vmax:   90.90 cm/s LVOT Vmean:  59.400 cm/s LVOT VTI:    0.211 m  AORTA Ao Root diam: 3.10 cm MITRAL VALVE MV Area (PHT): 4.89 cm    SHUNTS MV Decel Time: 155 msec    Systemic VTI:  0.21 m MV E velocity: 48.30 cm/s  Systemic Diam: 1.80 cm MV A velocity: 74.70 cm/s MV E/A ratio:  0.65 Mertie Moores MD Electronically signed by Mertie Moores MD Signature Date/Time: 03/10/2021/12:40:22 PM    Final    US THYROID  Result Date: 03/11/2021 CLINICAL DATA:  Nodule on CT neck EXAM: THYROID ULTRASOUND TECHNIQUE: Ultrasound examination of the thyroid gland and adjacent soft tissues was performed. COMPARISON:  CT 03/09/2021 FINDINGS: Parenchymal Echotexture: Normal Isthmus: 0.2 cm thickness Right lobe: 4.5 x 1.8 x 1.5 cm Left lobe: 3.7 x 1.3 x 1.4 cm _________________________________________________________ Estimated total number of nodules >/= 1 cm: 0 Number of spongiform nodules >/=  2 cm not described below (TR1): 0 Number of mixed cystic and solid nodules >/= 1.5 cm not described below ([place]): 0 _________________________________________________________ Nodule # 1: 0.8 cm isoechoic nodule without calcifications, inferior left; This nodule does NOT meet TI-RADS criteria for biopsy or dedicated follow-up. Nodule # 2:  0.7 cm hypoechoic nodule without calcifications, inferior left; This nodule does NOT meet TI-RADS criteria for biopsy or dedicated follow-up. No regional cervical adenopathy identified. Limited images of submandibular glands unremarkable. IMPRESSION: 1. Normal-sized thyroid with small left nodules, which do not meet criteria for biopsy or follow-up. The above is in keeping with the ACR TI-RADS recommendations - J Am Coll Radiol 2017;14:587-595. Electronically Signed   By: Lucrezia Europe M.D.   On: 03/11/2021 07:42   VAS US CAROTID  Result Date: 03/11/2021 Carotid Arterial Duplex Study Patient Name:  [facility]  Date of Exam:   03/10/2021 Medical Rec #:  [id number]       Accession #:    [id number] Date of Birth: 03-23-1961       Patient Gender: F Patient Age:   77Y Exam Location:  [facility] Procedure:      VAS US CAROTID Referring Phys: [id number] JARED M GARDNER --------------------------------------------------------------------------------  Indications:       Weakness and Left sided weakness and hoarse voice. Risk Factors:      Hypertension. Comparison Study:  No prior study Performing Technologist: Sharion Dove RVS  Examination Guidelines: A complete evaluation includes B-mode imaging, spectral Doppler, color Doppler, and power Doppler as needed of all accessible portions of each vessel. Bilateral testing is considered an integral part of a complete examination. Limited examinations for reoccurring indications may be performed as noted.  Right Carotid Findings: +----------+--------+--------+--------+------------------+------------------+           PSV cm/sEDV cm/sStenosisPlaque DescriptionComments           +----------+--------+--------+--------+------------------+------------------+ CCA Prox  94      26                                intimal thickening +----------+--------+--------+--------+------------------+------------------+ CCA Distal104     27                                intimal thickening +----------+--------+--------+--------+------------------+------------------+ ICA Prox  48      16                                                   +----------+--------+--------+--------+------------------+------------------+ ICA Distal91      34                                                   +----------+--------+--------+--------+------------------+------------------+ ECA       104     16                                                   +----------+--------+--------+--------+------------------+------------------+ +----------+--------+-------+--------+-------------------+           PSV cm/sEDV cmsDescribeArm  Pressure (mmHG) +----------+--------+-------+--------+-------------------+ [id number]                                         +----------+--------+-------+--------+-------------------+ +---------+--------+--+--------+--+  VertebralPSV cm/s87EDV cm/s24 +---------+--------+--+--------+--+  Left Carotid Findings: +----------+--------+--------+--------+------------------+------------------+           PSV cm/sEDV cm/sStenosisPlaque DescriptionComments           +----------+--------+--------+--------+------------------+------------------+ CCA Prox  99      26                                intimal thickening +----------+--------+--------+--------+------------------+------------------+ CCA Distal81      32                                intimal thickening +----------+--------+--------+--------+------------------+------------------+ ICA Prox  76      26                                                   +----------+--------+--------+--------+------------------+------------------+ ICA Distal94      36                                                   +----------+--------+--------+--------+------------------+------------------+ ECA       128     28                                                   +----------+--------+--------+--------+------------------+------------------+ +----------+--------+--------+--------+-------------------+           PSV cm/sEDV cm/sDescribeArm Pressure (mmHG) +----------+--------+--------+--------+-------------------+ [id number]                                          +----------+--------+--------+--------+-------------------+ +---------+--------+--+--------+--+ VertebralPSV cm/s78EDV cm/s24 +---------+--------+--+--------+--+   Summary: Right Carotid: The extracranial vessels were near-normal with only minimal wall                thickening or plaque. Left Carotid: The extracranial vessels were near-normal with only minimal  wall               thickening or plaque. Vertebrals:  Bilateral vertebral arteries demonstrate antegrade flow. Subclavians: Normal flow hemodynamics were seen in bilateral subclavian              arteries. *See table(s) above for measurements and observations.  Electronically signed by Monica Martinez MD on 03/11/2021 at 10:23:49 AM.    Final       PHYSICAL EXAM  Temp:  [97.5 F (36.4 C)-98.9 F (37.2 C)] 97.6 F (36.4 C) (07/12 1128) Pulse Rate:  [74-80] 77 (07/12 1128) Resp:  [14-16] 16 (07/12 1128) BP: (104-154)/(69-104) 141/76 (07/12 1128) SpO2:  [86 %-98 %] 96 % (07/12 1128)  General - Well nourished, well developed, in no apparent distress.  Ophthalmologic - fundi not visualized due to noncooperation.  Cardiovascular - Regular rhythm and rate.  Neck - still fullness left neck below left mastoid, but less tenderness.    Mental Status -  Level of arousal and orientation to time, place, and person were intact. Language including expression, naming, repetition, comprehension  was assessed and found intact. Attention span and concentration were normal. Fund of Knowledge was assessed and was intact.  Cranial Nerves II - XII - II - Visual field intact OU. III, IV, VI - Extraocular movements intact. V - Facial sensation intact bilaterally. VII - Facial movement intact bilaterally. VIII - Hearing & vestibular intact bilaterally. Left ear solitary reddish rash. X - Palate elevates symmetrically. Whisper voice on talking XI - Chin turning & shoulder shrug intact bilaterally. XII - Tongue protrusion intact.  Motor Strength - The patient's strength was normal in all extremities except left deltoid 4+/5.  Bulk was normal and fasciculations were absent.   Motor Tone - Muscle tone was assessed at the neck and appendages and was normal.  Reflexes - The patient's reflexes were symmetrical in all extremities and she had no pathological reflexes.  Sensory - Light touch,  temperature/pinprick were assessed and were symmetrical.    Coordination - The patient had normal movements in the hands and feet with no ataxia or dysmetria.  Tremor was absent.  Gait and Station - deferred.   ASSESSMENT/PLAN Ms. Malesha Suliman is a 60 y.o. female with history of HTN admitted for abnormal feeling of throat, left neck/back of ear tenderness, whisper voice, left shoulder weakness.   Whisper voice with left neck tenderness and left shoulder weakness, etiology unclear but concerning for focal process. 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 5) other symptoms including ear rash, left neck below mastoid tenderness do not consistent with stroke CT neg MRI and MRA neg CUS neg Chest CT negative Ultrasound thyroid 2 small thyroid nodules, no indication for biopsy 2D Echo  EF 60-65% LDL 123 HgbA1c 5.5 ESR and CRP negative. UDS, RPR and HIV neg RF 21 (normal < 14) Infectious work up pending, but lyme ab neg SCDs for VTE prophylaxis No antithrombotic prior to admission, on aspirin 81 mg daily for now.  ENT and ID involved, on doxycycline Pt prefer to hold off LP and seek for second opinion at [facility]. I feel it is reasonable and I do think the LP has low yield  Hypertension BP improved Gradually normalize BP On norvasc and lisinopril for BP management  Long term BP goal normotensive  B12 deficiency B12 = 167 On B12 supplement Continue on discharge  Other Risk Factors Use Mirena  Other [facility] day # 2  Neurology will sign off. Please call with  questions. Thanks for the consult.   Rosalin Hawking, MD PhD Stroke Neurology 03/12/2021 3:20 PM    To contact Stroke Continuity provider, please refer to [email or web address] After hours, contact General Neurology

## 2021-03-12 NOTE — Progress Notes (Signed)
Pt alert and oriented x4. Discharge paperwork reviewed with pt. Pt verbalized understanding.   Pt rolled down to front lobby by NT to be discharged home. Pt's husband to transport home. Pt has taken belongings with her. Pt in no acute distress upon discharge.

## 2021-03-12 NOTE — Care Management (Signed)
Spoke w patient and spouse on speakerphone. No CM needs for DC. They will follow up with their PCP in [facility] after they get back on Thursday for referrals to local outpatient PT OT ST.  They have requested disc of images to be sent home with them, I have forwarded this request to the nurse.

## 2021-03-12 NOTE — Progress Notes (Signed)
[facility] Infectious Diseases Follow Up Note  Patient Identification: Patient Name: Alexis Kerr MRN: [id number] [place] Date: 03/09/2021  1:08 PM Age: 60 y.o.Today's Date: 03/12/2021   Reason for Visit: Idiopathic vocal cord paralysis   Principal Problem:   Vocal cord paralysis Active Problems:   Localized swelling, mass and lump, neck   Dysphonia   Pure hypercholesterolemia   Antibiotics: Doxycyline 7/11-current  Lines/Tubes: PIV   Interval Events: continues to be afebrile, no leukocytosis   Assessment Left Vocal Cord Paralysis, Unclear cause  CT head neg MRI brain with non specific findings MRI C spine unremarkable  MRA neg CUS neg Chest CT negative Ultrasound thyroid 2 small thyroid nodules, no indication for biopsy   Left Arm weakness ( deltoid) Dysphagia/Sore throat Left ear canal single pustular lesion in the setting of insect bite   Comments: Planned for LP by Neurology has been cancelled per patient's request as she would like to get a second opinion from [place]. She is planning to drive back home on Thursday.  Labs: RPR NR, hep panel NR, SARSCoV 2 NR, Group A strep PCP negative, HIV NR. TSH WNL RF mildy elevated 21.1            ESR and CRP WNL   Recommendations Continue Doxycyline, can complete 10 days course Follow up pending ID labs  Following ENT and Neurology recs  Patient will need to follow up with ENT and ID at her hometown if planned to go home   Rest of the management as per the primary team. Thank you for the consult. Please page with pertinent questions or concerns.  ______________________________________________________________________ Subjective patient seen and examined at the bedside. Feels left side neck pain and left arm weakness is somewhat better. No new symptoms. She tells me she had a h/o mononucleosis in college. Contiues to  Vitals BP (!) 141/76 (BP Location: Left Arm)    Pulse 77   Temp 97.6 F (36.4 C) (Oral)   Resp 16   Ht _0  (1.6 m)   Wt 75.1 kg   LMP  (LMP Unknown)   SpO2 96%   BMI 29.33 kg/m     Physical Exam Constitutional:  sitting up in bed, no changes in quality of voice ( whispering voice)    Comments: left eck swelling/edema improving   Cardiovascular:     Rate and Rhythm: Normal rate and regular rhythm.     Heart sounds: No murmur heard.   Pulmonary:     Effort: Pulmonary effort is normal.     Comments:   Abdominal:     Palpations: Abdomen is soft.     Tenderness:   Musculoskeletal:        General: No swelling or tenderness.   Skin:    Comments: No lesions or rashes   Neurological:     General: left arm strength is 4/5, no changes from yesterday  Psychiatric:        Mood and Affect: Mood normal.   Pertinent Microbiology Results for orders placed or performed during the hospital encounter of 03/09/21  Resp Panel by RT-PCR (Flu A&B, Covid) Nasopharyngeal Swab     Status: None   Collection Time: 03/09/21  2:31 PM   Specimen: Nasopharyngeal Swab; Nasopharyngeal(NP) swabs in vial transport medium  Result Value Ref Range Status   SARS Coronavirus 2 by RT PCR NEGATIVE NEGATIVE Final    Comment: (NOTE) SARS-CoV-2 target nucleic acids are NOT DETECTED.  The SARS-CoV-2 RNA is generally detectable in upper respiratory  specimens during the acute phase of infection. The lowest concentration of SARS-CoV-2 viral copies this assay can detect is 138 copies/mL. A negative result does not preclude SARS-Cov-2 infection and should not be used as the sole basis for treatment or other patient management decisions. A negative result may occur with  improper specimen collection/handling, submission of specimen other than nasopharyngeal swab, presence of viral mutation(s) within the areas targeted by this assay, and inadequate number of viral copies(<138 copies/mL). A negative result must be combined with clinical observations,  patient history, and epidemiological information. The expected result is Negative.  Fact Sheet for Patients:  EntrepreneurPulse.com.au  Fact Sheet for Healthcare Providers:  IncredibleEmployment.be  This test is no t yet approved or cleared by the [place] FDA and  has been authorized for detection and/or diagnosis of SARS-CoV-2 by FDA under an Emergency Use Authorization (EUA). This EUA will remain  in effect (meaning this test can be used) for the duration of the COVID-19 declaration under Section 564(b)(1) of the Act, 21 U.S.C.section 360bbb-3(b)(1), unless the authorization is terminated  or revoked sooner.       Influenza A by PCR NEGATIVE NEGATIVE Final   Influenza B by PCR NEGATIVE NEGATIVE Final    Comment: (NOTE) The Xpert Xpress SARS-CoV-2/FLU/RSV plus assay is intended as an aid in the diagnosis of influenza from Nasopharyngeal swab specimens and should not be used as a sole basis for treatment. Nasal washings and aspirates are unacceptable for Xpert Xpress SARS-CoV-2/FLU/RSV testing.  Fact Sheet for Patients: EntrepreneurPulse.com.au  Fact Sheet for Healthcare Providers: IncredibleEmployment.be  This test is not yet approved or cleared by the [place] FDA and has been authorized for detection and/or diagnosis of SARS-CoV-2 by FDA under an Emergency Use Authorization (EUA). This EUA will remain in effect (meaning this test can be used) for the duration of the COVID-19 declaration under Section 564(b)(1) of the Act, 21 U.S.C. section 360bbb-3(b)(1), unless the authorization is terminated or revoked.  Performed at [facility], [address] [place], [address] [address]   Group A Strep by PCR     Status: None   Collection Time: 03/09/21  4:39 PM   Specimen: Throat; Sterile Swab  Result Value Ref Range Status   Group A Strep by PCR NOT DETECTED NOT DETECTED  Final    Comment: Performed at [facility], [address] [place], [place] [address]     Pertinent Lab. CBC Latest Ref Rng & Units 03/09/2021  WBC 4.0 - 10.5 K/uL 7.3  Hemoglobin 12.0 - 15.0 g/dL 14.0  Hematocrit 36.0 - 46.0 % 41.0  Platelets 150 - 400 K/uL 246   CMP Latest Ref Rng & Units 03/11/2021 03/09/2021  Glucose 70 - 99 mg/dL 135(H) 94  BUN 6 - 20 mg/dL 9 15  Creatinine 0.44 - 1.00 mg/dL 0.75 0.72  Sodium 135 - 145 mmol/L 139 141  Potassium 3.5 - 5.1 mmol/L 3.5 3.8  Chloride 98 - 111 mmol/L 104 105  CO2 22 - 32 mmol/L 25 27  Calcium 8.9 - 10.3 mg/dL 9.6 9.6  Total Protein 6.5 - 8.1 g/dL - 7.4  Total Bilirubin 0.3 - 1.2 mg/dL - 0.7  Alkaline Phos 38 - 126 U/L - 72  AST 15 - 41 U/L - 17  ALT 0 - 44 U/L - 17     Pertinent Imaging today Plain films and CT images have been personally visualized and interpreted; radiology reports have been reviewed. Decision making incorporated into the Impression /  Recommendations.  I spent more than 35 minutes for this patient encounter including review of prior medical records, coordination of care  with greater than 50% of time being face to face/counseling and discussing diagnostics/treatment plan with the patient/family.  Electronically signed by:   Rosiland Oz, MD Infectious Disease Physician [facility] Pager: [phone]

## 2021-03-12 NOTE — Plan of Care (Signed)
  Problem: Nutrition: Goal: Dietary intake will improve Outcome: Progressing   Problem: Education: Goal: Knowledge of General Education information will improve Description: Including pain rating scale, medication(s)/side effects and non-pharmacologic comfort measures Outcome: Progressing   Problem: Clinical Measurements: Goal: Ability to maintain clinical measurements within normal limits will improve Outcome: Progressing Goal: Will remain free from infection Outcome: Progressing Goal: Cardiovascular complication will be avoided Outcome: Progressing   Problem: Activity: Goal: Risk for activity intolerance will decrease Outcome: Progressing   Problem: Nutrition: Goal: Adequate nutrition will be maintained Outcome: Progressing   Problem: Coping: Goal: Level of anxiety will decrease Outcome: Progressing   Problem: Elimination: Goal: Will not experience complications related to urinary retention Outcome: Progressing   Problem: Pain Managment: Goal: General experience of comfort will improve Outcome: Progressing   Problem: Skin Integrity: Goal: Risk for impaired skin integrity will decrease Outcome: Progressing

## 2021-03-13 LAB — CMV ANTIBODY, IGG (EIA): CMV Ab - IgG: 9.7 U/mL — ABNORMAL HIGH (ref 0.00–0.59)

## 2021-03-13 LAB — CMV IGM: CMV IgM: <30 AU/mL (ref 0.0–29.9)

## 2021-03-13 LAB — HSV(HERPES SIMPLEX VRS) I + II AB-IGG
HSV 1 Glycoprotein G Ab, IgG: <0.91 index (ref 0.00–0.90)
HSV 2 Glycoprotein G Ab, IgG: <0.91 index (ref 0.00–0.90)

## 2021-03-13 LAB — ANGIOTENSIN CONVERTING ENZYME: Angiotensin-Converting Enzyme: 6 U/L — ABNORMAL LOW (ref 14–82)

## 2021-03-14 LAB — EPSTEIN-BARR VIRUS EARLY D ANTIGEN ANTIBODY, IGG: EBV Early Antigen Ab, IgG: 14.3 U/mL — ABNORMAL HIGH (ref 0.0–8.9)

## 2021-03-14 LAB — EPSTEIN-BARR VIRUS NUCLEAR ANTIGEN ANTIBODY, IGG: EBV NA IgG: >600 U/mL — ABNORMAL HIGH (ref 0.0–17.9)

## 2021-03-14 LAB — CMV DNA, QUANTITATIVE, PCR
CMV DNA Quant: NEGATIVE IU/mL
Log10 CMV Qn DNA Pl: UNDETERMINED log10 IU/mL

## 2021-03-14 LAB — EPSTEIN-BARR VIRUS VCA, IGG: EBV VCA IgG: 268 U/mL — ABNORMAL HIGH (ref 0.0–17.9)

## 2021-03-14 LAB — EPSTEIN-BARR VIRUS VCA, IGM: EBV VCA IgM: <36 U/mL (ref 0.0–35.9)

## 2021-03-14 LAB — HSV DNA BY PCR (REFERENCE LAB)
HSV 1 DNA: NEGATIVE
HSV 2 DNA: NEGATIVE

## 2021-03-20 LAB — ANTINUCLEAR ANTIBODIES, IFA: ANA Ab, IFA: NEGATIVE

## 2024-06-16 ENCOUNTER — Other Ambulatory Visit (HOSPITAL_BASED_OUTPATIENT_CLINIC_OR_DEPARTMENT_OTHER): Payer: Self-pay
